# Patient Record
Sex: Male | Born: 1940
Health system: Southern US, Community
[De-identification: ages and names within clinical notes are randomized; demographics above are authoritative.]

## PROBLEM LIST (undated history)

## (undated) DIAGNOSIS — K219 Gastro-esophageal reflux disease without esophagitis: Secondary | ICD-10-CM

## (undated) DIAGNOSIS — Z87442 Personal history of urinary calculi: Secondary | ICD-10-CM

## (undated) DIAGNOSIS — J8410 Pulmonary fibrosis, unspecified: Secondary | ICD-10-CM

## (undated) DIAGNOSIS — N433 Hydrocele, unspecified: Secondary | ICD-10-CM

## (undated) DIAGNOSIS — N200 Calculus of kidney: Secondary | ICD-10-CM

## (undated) DIAGNOSIS — I10 Essential (primary) hypertension: Secondary | ICD-10-CM

## (undated) DIAGNOSIS — H9319 Tinnitus, unspecified ear: Secondary | ICD-10-CM

## (undated) DIAGNOSIS — E785 Hyperlipidemia, unspecified: Secondary | ICD-10-CM

## (undated) DIAGNOSIS — M47816 Spondylosis without myelopathy or radiculopathy, lumbar region: Secondary | ICD-10-CM

## (undated) DIAGNOSIS — K5790 Diverticulosis of intestine, part unspecified, without perforation or abscess without bleeding: Secondary | ICD-10-CM

## (undated) DIAGNOSIS — I4891 Unspecified atrial fibrillation: Secondary | ICD-10-CM

## (undated) DIAGNOSIS — K635 Polyp of colon: Secondary | ICD-10-CM

## (undated) DIAGNOSIS — I447 Left bundle-branch block, unspecified: Secondary | ICD-10-CM

## (undated) DIAGNOSIS — C61 Malignant neoplasm of prostate: Secondary | ICD-10-CM

## (undated) DIAGNOSIS — M5030 Other cervical disc degeneration, unspecified cervical region: Secondary | ICD-10-CM

## (undated) DIAGNOSIS — Z9841 Cataract extraction status, right eye: Secondary | ICD-10-CM

## (undated) DIAGNOSIS — Z7901 Long term (current) use of anticoagulants: Secondary | ICD-10-CM

## (undated) DIAGNOSIS — N529 Male erectile dysfunction, unspecified: Secondary | ICD-10-CM

## (undated) DIAGNOSIS — N320 Bladder-neck obstruction: Secondary | ICD-10-CM

## (undated) DIAGNOSIS — M48061 Spinal stenosis, lumbar region without neurogenic claudication: Secondary | ICD-10-CM

## (undated) DIAGNOSIS — I700 Atherosclerosis of aorta: Secondary | ICD-10-CM

## (undated) DIAGNOSIS — K8070 Calculus of gallbladder and bile duct without cholecystitis without obstruction: Secondary | ICD-10-CM

## (undated) DIAGNOSIS — J189 Pneumonia, unspecified organism: Secondary | ICD-10-CM

## (undated) DIAGNOSIS — M1990 Unspecified osteoarthritis, unspecified site: Secondary | ICD-10-CM

## (undated) DIAGNOSIS — R0600 Dyspnea, unspecified: Secondary | ICD-10-CM

## (undated) HISTORY — DX: Unspecified osteoarthritis, unspecified site: M19.90

## (undated) HISTORY — PX: HIP ARTHROPLASTY: SHX981

## (undated) HISTORY — PX: BACK SURGERY: SHX140

## (undated) HISTORY — PX: CATARACT EXTRACTION: SUR2

## (undated) HISTORY — PX: POLYPECTOMY: SHX149

## (undated) HISTORY — DX: Gastro-esophageal reflux disease without esophagitis: K21.9

## (undated) HISTORY — PX: PERINEAL PROSTATECTOMY: SUR1054

## (undated) HISTORY — PX: EYE SURGERY: SHX253

## (undated) HISTORY — PX: JOINT REPLACEMENT: SHX530

## (undated) HISTORY — PX: TONSILLECTOMY: SUR1361

---

## 2005-05-30 ENCOUNTER — Emergency Department: Payer: Self-pay | Admitting: Emergency Medicine

## 2005-09-17 ENCOUNTER — Ambulatory Visit: Payer: Self-pay | Admitting: Urology

## 2005-11-10 ENCOUNTER — Inpatient Hospital Stay: Payer: Self-pay | Admitting: Urology

## 2006-02-22 ENCOUNTER — Ambulatory Visit: Payer: Self-pay | Admitting: Internal Medicine

## 2006-07-20 HISTORY — PX: LUMBAR LAMINECTOMY: SHX95

## 2006-08-27 ENCOUNTER — Ambulatory Visit: Payer: Self-pay | Admitting: Gastroenterology

## 2007-06-20 ENCOUNTER — Ambulatory Visit: Payer: Self-pay | Admitting: Internal Medicine

## 2010-07-01 ENCOUNTER — Ambulatory Visit: Payer: Self-pay | Admitting: Unknown Physician Specialty

## 2010-07-04 LAB — PATHOLOGY REPORT

## 2011-02-18 ENCOUNTER — Ambulatory Visit: Payer: Self-pay | Admitting: Internal Medicine

## 2011-02-23 ENCOUNTER — Ambulatory Visit: Payer: Self-pay | Admitting: Internal Medicine

## 2011-04-10 ENCOUNTER — Ambulatory Visit: Payer: Self-pay | Admitting: Unknown Physician Specialty

## 2011-04-16 ENCOUNTER — Ambulatory Visit: Payer: Self-pay | Admitting: Unknown Physician Specialty

## 2011-05-18 ENCOUNTER — Other Ambulatory Visit: Payer: Self-pay | Admitting: Surgery

## 2011-10-12 ENCOUNTER — Ambulatory Visit: Payer: Self-pay | Admitting: Unknown Physician Specialty

## 2012-12-22 DIAGNOSIS — C61 Malignant neoplasm of prostate: Secondary | ICD-10-CM | POA: Insufficient documentation

## 2012-12-22 DIAGNOSIS — N529 Male erectile dysfunction, unspecified: Secondary | ICD-10-CM | POA: Insufficient documentation

## 2012-12-22 DIAGNOSIS — Z8546 Personal history of malignant neoplasm of prostate: Secondary | ICD-10-CM | POA: Insufficient documentation

## 2012-12-22 DIAGNOSIS — N320 Bladder-neck obstruction: Secondary | ICD-10-CM | POA: Insufficient documentation

## 2013-12-19 DIAGNOSIS — H9319 Tinnitus, unspecified ear: Secondary | ICD-10-CM | POA: Insufficient documentation

## 2013-12-19 DIAGNOSIS — I10 Essential (primary) hypertension: Secondary | ICD-10-CM | POA: Insufficient documentation

## 2013-12-19 DIAGNOSIS — K5790 Diverticulosis of intestine, part unspecified, without perforation or abscess without bleeding: Secondary | ICD-10-CM | POA: Insufficient documentation

## 2013-12-19 DIAGNOSIS — E785 Hyperlipidemia, unspecified: Secondary | ICD-10-CM | POA: Insufficient documentation

## 2013-12-19 DIAGNOSIS — M5137 Other intervertebral disc degeneration, lumbosacral region: Secondary | ICD-10-CM | POA: Insufficient documentation

## 2015-07-31 ENCOUNTER — Encounter: Payer: Self-pay | Admitting: *Deleted

## 2015-08-01 ENCOUNTER — Ambulatory Visit: Payer: PPO | Admitting: Anesthesiology

## 2015-08-01 ENCOUNTER — Encounter
Admission: RE | Disposition: A | Discharge status: Home / Self Care | Payer: Self-pay | Source: Ambulatory Visit | Attending: Unknown Physician Specialty

## 2015-08-01 ENCOUNTER — Encounter: Payer: Self-pay | Admitting: *Deleted

## 2015-08-01 ENCOUNTER — Ambulatory Visit
Admission: RE | Admit: 2015-08-01 | Discharge: 2015-08-01 | Disposition: A | Discharge status: Home / Self Care | Payer: PPO | Source: Ambulatory Visit | Attending: Unknown Physician Specialty | Admitting: Unknown Physician Specialty

## 2015-08-01 DIAGNOSIS — Z8546 Personal history of malignant neoplasm of prostate: Secondary | ICD-10-CM | POA: Diagnosis not present

## 2015-08-01 DIAGNOSIS — D123 Benign neoplasm of transverse colon: Secondary | ICD-10-CM | POA: Diagnosis not present

## 2015-08-01 DIAGNOSIS — I10 Essential (primary) hypertension: Secondary | ICD-10-CM | POA: Insufficient documentation

## 2015-08-01 DIAGNOSIS — K648 Other hemorrhoids: Secondary | ICD-10-CM | POA: Diagnosis not present

## 2015-08-01 DIAGNOSIS — D122 Benign neoplasm of ascending colon: Secondary | ICD-10-CM | POA: Diagnosis not present

## 2015-08-01 DIAGNOSIS — Z87891 Personal history of nicotine dependence: Secondary | ICD-10-CM | POA: Insufficient documentation

## 2015-08-01 DIAGNOSIS — Z1211 Encounter for screening for malignant neoplasm of colon: Secondary | ICD-10-CM | POA: Diagnosis not present

## 2015-08-01 DIAGNOSIS — K5730 Diverticulosis of large intestine without perforation or abscess without bleeding: Secondary | ICD-10-CM | POA: Diagnosis not present

## 2015-08-01 DIAGNOSIS — D120 Benign neoplasm of cecum: Secondary | ICD-10-CM | POA: Diagnosis not present

## 2015-08-01 DIAGNOSIS — Z79899 Other long term (current) drug therapy: Secondary | ICD-10-CM | POA: Insufficient documentation

## 2015-08-01 DIAGNOSIS — K5790 Diverticulosis of intestine, part unspecified, without perforation or abscess without bleeding: Secondary | ICD-10-CM | POA: Diagnosis not present

## 2015-08-01 DIAGNOSIS — K640 First degree hemorrhoids: Secondary | ICD-10-CM | POA: Insufficient documentation

## 2015-08-01 DIAGNOSIS — E785 Hyperlipidemia, unspecified: Secondary | ICD-10-CM | POA: Insufficient documentation

## 2015-08-01 DIAGNOSIS — K635 Polyp of colon: Secondary | ICD-10-CM | POA: Diagnosis not present

## 2015-08-01 DIAGNOSIS — Z86010 Personal history of colonic polyps: Secondary | ICD-10-CM | POA: Diagnosis not present

## 2015-08-01 DIAGNOSIS — Z7982 Long term (current) use of aspirin: Secondary | ICD-10-CM | POA: Insufficient documentation

## 2015-08-01 HISTORY — DX: Other cervical disc degeneration, unspecified cervical region: M50.30

## 2015-08-01 HISTORY — DX: Hyperlipidemia, unspecified: E78.5

## 2015-08-01 HISTORY — DX: Essential (primary) hypertension: I10

## 2015-08-01 HISTORY — PX: COLONOSCOPY WITH PROPOFOL: SHX5780

## 2015-08-01 HISTORY — DX: Malignant neoplasm of prostate: C61

## 2015-08-01 SURGERY — COLONOSCOPY WITH PROPOFOL
Anesthesia: General

## 2015-08-01 MED ORDER — SODIUM CHLORIDE 0.9 % IV SOLN
INTRAVENOUS | Status: DC
Start: 2015-08-01 — End: 2015-08-01
  Administered 2015-08-01: 11:00:00 via INTRAVENOUS

## 2015-08-01 MED ORDER — SODIUM CHLORIDE 0.9 % IV SOLN
INTRAVENOUS | Status: DC | PRN
Start: 2015-08-01 — End: 2015-08-01
  Administered 2015-08-01: 11:00:00 via INTRAVENOUS

## 2015-08-01 MED ORDER — SODIUM CHLORIDE 0.9 % IV SOLN: INTRAVENOUS | Status: DC

## 2015-08-01 MED ORDER — PROPOFOL 10 MG/ML IV BOLUS
INTRAVENOUS | Status: DC | PRN
  Administered 2015-08-01: 230 mg via INTRAVENOUS

## 2015-08-01 NOTE — Transfer of Care (Signed)
Immediate Anesthesia Transfer of Care Note  Patient: John Marks  Procedure(s) Performed: Procedure(s): COLONOSCOPY WITH PROPOFOL (N/A)  Patient Location: PACU  Anesthesia Type:General  Level of Consciousness: awake, alert  and oriented  Airway & Oxygen Therapy: Patient Spontanous Breathing and Patient connected to nasal cannula oxygen  Post-op Assessment: Report given to RN and Post -op Vital signs reviewed and stable  Post vital signs: Reviewed and stable  Last Vitals:  Filed Vitals:   08/01/15 1031 08/01/15 1154  BP: 128/82 110/77  Pulse: 86 71  Temp: 36.8 C 36.4 C  Resp: 14 11    Complications: No apparent anesthesia complications

## 2015-08-01 NOTE — Anesthesia Preprocedure Evaluation (Addendum)
Anesthesia Evaluation  Patient identified by MRN, date of birth, ID band Patient awake    Reviewed: Allergy & Precautions, H&P , NPO status , Patient's Chart, lab work & pertinent test results, reviewed documented beta blocker date and time   Airway Mallampati: II  TM Distance: >3 FB Neck ROM: full    Dental no notable dental hx. (+) Teeth Intact   Pulmonary neg pulmonary ROS, former smoker,    Pulmonary exam normal breath sounds clear to auscultation       Cardiovascular Exercise Tolerance: Good hypertension, negative cardio ROS   Rhythm:regular Rate:Normal     Neuro/Psych negative neurological ROS  negative psych ROS   GI/Hepatic negative GI ROS, Neg liver ROS,   Endo/Other  negative endocrine ROS  Renal/GU negative Renal ROS  negative genitourinary   Musculoskeletal   Abdominal   Peds  Hematology negative hematology ROS (+)   Anesthesia Other Findings   Reproductive/Obstetrics negative OB ROS                            Anesthesia Physical Anesthesia Plan  ASA: II  Anesthesia Plan: General   Post-op Pain Management:    Induction:   Airway Management Planned:   Additional Equipment:   Intra-op Plan:   Post-operative Plan:   Informed Consent: I have reviewed the patients History and Physical, chart, labs and discussed the procedure including the risks, benefits and alternatives for the proposed anesthesia with the patient or authorized representative who has indicated his/her understanding and acceptance.   Dental Advisory Given  Plan Discussed with: CRNA  Anesthesia Plan Comments:         Anesthesia Quick Evaluation

## 2015-08-01 NOTE — Op Note (Signed)
[facility] Patient Name: John Marks Procedure Date: 08/01/2015 11:21 AM MRN: [id number] Account #: [id number] Date of Birth: February 14, 1941 Admit Type: Outpatient Age: 75 Room: [facility] ENDO ROOM 1 Gender: Male Note Status: Finalized Procedure:         Colonoscopy Indications:       High risk colon cancer surveillance: Personal history of                     colonic polyps Providers:         Manya Silvas, MD Referring MD:      Leonie Douglas. Doy Hutching, MD (Referring MD) Medicines:         Propofol per Anesthesia Complications:     No immediate complications. Procedure:         Pre-Anesthesia Assessment:                    - After reviewing the risks and benefits, the patient was                     deemed in satisfactory condition to undergo the procedure.                    After obtaining informed consent, the colonoscope was                     passed under direct vision. Throughout the procedure, the                     patient's blood pressure, pulse, and oxygen saturations                     were monitored continuously. The Colonoscope was                     introduced through the anus and advanced to the the cecum,                     identified by appendiceal orifice and ileocecal valve. The                     colonoscopy was somewhat difficult due to restricted                     mobility of the colon. Successful completion of the                     procedure was aided by applying abdominal pressure. The                     patient tolerated the procedure well. The quality of the                     bowel preparation was good. Findings:      A diminutive polyp was found at the hepatic flexure. The polyp was       sessile. The polyp was removed with a jumbo cold forceps. Resection and       retrieval were complete.      Multiple small-mouthed diverticula were found in the sigmoid colon and       in the descending colon.      Internal  hemorrhoids were found during endoscopy. The hemorrhoids were       small and Grade I (internal hemorrhoids that do not  prolapse).      The exam was otherwise without abnormality.      A diminutive polyp was found in the ascending colon. The polyp was       sessile. The polyp was removed with a cold biopsy forceps. Resection and       retrieval were complete. Impression:        - One diminutive polyp at the hepatic flexure. Resected                     and retrieved.                    - Diverticulosis in the sigmoid colon and in the                     descending colon.                    - Internal hemorrhoids.                    - The examination was otherwise normal. Recommendation:    - Await pathology results. Manya Silvas, MD 08/01/2015 11:53:54 AM This report has been signed electronically. Number of Addenda: 0 Note Initiated On: 08/01/2015 11:21 AM Scope Withdrawal Time: 0 hours 12 minutes 1 second  Total Procedure Duration: 0 hours 20 minutes 31 seconds       [facility]

## 2015-08-01 NOTE — H&P (Signed)
   Primary Care Physician:  Idelle Crouch, MD Primary Gastroenterologist:  Dr. Vira Agar  Pre-Procedure History & Physical: HPI:  John Marks is a 75 y.o. male is here for an colonoscopy.   Past Medical History  Diagnosis Date  . Hyperlipidemia   . Prostate cancer ([place])   . Hypertension   . DDD (degenerative disc disease), cervical     Past Surgical History  Procedure Laterality Date  . Perineal prostatectomy      Prior to Admission medications   Medication Sig Start Date End Date Taking? Authorizing Provider  aspirin 81 MG tablet Take 81 mg by mouth daily.   Yes Historical Provider, MD  atorvastatin (LIPITOR) 40 MG tablet Take 40 mg by mouth daily.   Yes Historical Provider, MD  Choline Fenofibrate 135 MG capsule Take 135 mg by mouth daily.   Yes Historical Provider, MD  levofloxacin (LEVAQUIN) 500 MG tablet Take 500 mg by mouth daily.   Yes Historical Provider, MD    Allergies as of 07/04/2015  . (Not on File)    History reviewed. No pertinent family history.  Social History   Social History  . Marital Status: Married    Spouse Name: N/A  . Number of Children: N/A  . Years of Education: N/A   Occupational History  . Not on file.   Social History Main Topics  . Smoking status: Former Smoker    Quit date: 07/21/1987  . Smokeless tobacco: Never Used  . Alcohol Use: 0.6 oz/week    1 Glasses of wine per week  . Drug Use: No  . Sexual Activity: Not on file   Other Topics Concern  . Not on file   Social History Narrative    Review of Systems: See HPI, otherwise negative ROS  Physical Exam: BP 128/82 mmHg  Pulse 86  Temp(Src) 98.3 F (36.8 C) (Tympanic)  Resp 14  Ht 6' (1.829 m)  Wt 92.987 kg (205 lb)  BMI 27.80 kg/m2  SpO2 98% General:   Alert,  pleasant and cooperative in NAD Head:  Normocephalic and atraumatic. Neck:  Supple; no masses or thyromegaly. Lungs:  Clear throughout to auscultation.    Heart:  Regular rate and rhythm. Abdomen:   Soft, nontender and nondistended. Normal bowel sounds, without guarding, and without rebound.   Neurologic:  Alert and  oriented x4;  grossly normal neurologically.  Impression/Plan: John Marks is here for an colonoscopy to be performed for [facility] colon polyps  Risks, benefits, limitations, and alternatives regarding  colonoscopy have been reviewed with the patient.  Questions have been answered.  All parties agreeable.   Gaylyn Cheers, MD  08/01/2015, 11:20 AM

## 2015-08-02 LAB — SURGICAL PATHOLOGY

## 2015-08-04 NOTE — Anesthesia Postprocedure Evaluation (Signed)
Anesthesia Post Note  Patient: John Marks  Procedure(s) Performed: Procedure(s) (LRB): COLONOSCOPY WITH PROPOFOL (N/A)  Patient location during evaluation: PACU Anesthesia Type: General Level of consciousness: awake and alert Pain management: pain level controlled Vital Signs Assessment: post-procedure vital signs reviewed and stable Respiratory status: spontaneous breathing, nonlabored ventilation, respiratory function stable and patient connected to nasal cannula oxygen Cardiovascular status: blood pressure returned to baseline and stable Postop Assessment: no signs of nausea or vomiting Anesthetic complications: no    Last Vitals:  Filed Vitals:   08/01/15 1220 08/01/15 1230  BP: 143/70 136/77  Pulse: 69 65  Temp:    Resp: 18 15    Last Pain:  Filed Vitals:   08/02/15 0747  PainSc: 0-No pain                 Molli Barrows

## 2015-11-18 DIAGNOSIS — Z79899 Other long term (current) drug therapy: Secondary | ICD-10-CM | POA: Diagnosis not present

## 2015-11-18 DIAGNOSIS — E782 Mixed hyperlipidemia: Secondary | ICD-10-CM | POA: Diagnosis not present

## 2015-11-18 DIAGNOSIS — H6122 Impacted cerumen, left ear: Secondary | ICD-10-CM | POA: Diagnosis not present

## 2015-11-18 DIAGNOSIS — M5137 Other intervertebral disc degeneration, lumbosacral region: Secondary | ICD-10-CM | POA: Diagnosis not present

## 2015-11-18 DIAGNOSIS — C61 Malignant neoplasm of prostate: Secondary | ICD-10-CM | POA: Diagnosis not present

## 2015-11-18 DIAGNOSIS — Z0000 Encounter for general adult medical examination without abnormal findings: Secondary | ICD-10-CM | POA: Diagnosis not present

## 2015-11-18 DIAGNOSIS — I10 Essential (primary) hypertension: Secondary | ICD-10-CM | POA: Diagnosis not present

## 2015-11-19 DIAGNOSIS — E782 Mixed hyperlipidemia: Secondary | ICD-10-CM | POA: Diagnosis not present

## 2015-11-19 DIAGNOSIS — Z79899 Other long term (current) drug therapy: Secondary | ICD-10-CM | POA: Diagnosis not present

## 2015-12-19 DIAGNOSIS — D485 Neoplasm of uncertain behavior of skin: Secondary | ICD-10-CM | POA: Diagnosis not present

## 2015-12-19 DIAGNOSIS — L218 Other seborrheic dermatitis: Secondary | ICD-10-CM | POA: Diagnosis not present

## 2015-12-19 DIAGNOSIS — X32XXXA Exposure to sunlight, initial encounter: Secondary | ICD-10-CM | POA: Diagnosis not present

## 2015-12-19 DIAGNOSIS — Z08 Encounter for follow-up examination after completed treatment for malignant neoplasm: Secondary | ICD-10-CM | POA: Diagnosis not present

## 2015-12-19 DIAGNOSIS — C44311 Basal cell carcinoma of skin of nose: Secondary | ICD-10-CM | POA: Diagnosis not present

## 2015-12-19 DIAGNOSIS — L570 Actinic keratosis: Secondary | ICD-10-CM | POA: Diagnosis not present

## 2015-12-19 DIAGNOSIS — Z85828 Personal history of other malignant neoplasm of skin: Secondary | ICD-10-CM | POA: Diagnosis not present

## 2015-12-30 DIAGNOSIS — H6123 Impacted cerumen, bilateral: Secondary | ICD-10-CM | POA: Diagnosis not present

## 2015-12-30 DIAGNOSIS — H6062 Unspecified chronic otitis externa, left ear: Secondary | ICD-10-CM | POA: Diagnosis not present

## 2016-02-04 DIAGNOSIS — X32XXXA Exposure to sunlight, initial encounter: Secondary | ICD-10-CM | POA: Diagnosis not present

## 2016-02-04 DIAGNOSIS — L728 Other follicular cysts of the skin and subcutaneous tissue: Secondary | ICD-10-CM | POA: Diagnosis not present

## 2016-02-04 DIAGNOSIS — L570 Actinic keratosis: Secondary | ICD-10-CM | POA: Diagnosis not present

## 2016-02-19 DIAGNOSIS — Z8546 Personal history of malignant neoplasm of prostate: Secondary | ICD-10-CM | POA: Diagnosis not present

## 2016-02-19 DIAGNOSIS — N529 Male erectile dysfunction, unspecified: Secondary | ICD-10-CM | POA: Diagnosis not present

## 2016-02-19 DIAGNOSIS — N320 Bladder-neck obstruction: Secondary | ICD-10-CM | POA: Diagnosis not present

## 2016-03-10 DIAGNOSIS — C44311 Basal cell carcinoma of skin of nose: Secondary | ICD-10-CM | POA: Diagnosis not present

## 2016-03-19 DIAGNOSIS — D485 Neoplasm of uncertain behavior of skin: Secondary | ICD-10-CM | POA: Diagnosis not present

## 2016-03-19 DIAGNOSIS — L989 Disorder of the skin and subcutaneous tissue, unspecified: Secondary | ICD-10-CM | POA: Diagnosis not present

## 2016-04-06 DIAGNOSIS — H02839 Dermatochalasis of unspecified eye, unspecified eyelid: Secondary | ICD-10-CM | POA: Diagnosis not present

## 2016-04-06 DIAGNOSIS — H2513 Age-related nuclear cataract, bilateral: Secondary | ICD-10-CM | POA: Diagnosis not present

## 2016-05-21 DIAGNOSIS — Z23 Encounter for immunization: Secondary | ICD-10-CM | POA: Diagnosis not present

## 2016-05-21 DIAGNOSIS — E782 Mixed hyperlipidemia: Secondary | ICD-10-CM | POA: Diagnosis not present

## 2016-05-21 DIAGNOSIS — M1611 Unilateral primary osteoarthritis, right hip: Secondary | ICD-10-CM | POA: Diagnosis not present

## 2016-05-21 DIAGNOSIS — I10 Essential (primary) hypertension: Secondary | ICD-10-CM | POA: Diagnosis not present

## 2016-05-21 DIAGNOSIS — M25512 Pain in left shoulder: Secondary | ICD-10-CM | POA: Diagnosis not present

## 2016-05-21 DIAGNOSIS — C61 Malignant neoplasm of prostate: Secondary | ICD-10-CM | POA: Diagnosis not present

## 2016-05-21 DIAGNOSIS — M19012 Primary osteoarthritis, left shoulder: Secondary | ICD-10-CM | POA: Diagnosis not present

## 2016-05-21 DIAGNOSIS — M25551 Pain in right hip: Secondary | ICD-10-CM | POA: Diagnosis not present

## 2016-05-21 DIAGNOSIS — M5137 Other intervertebral disc degeneration, lumbosacral region: Secondary | ICD-10-CM | POA: Diagnosis not present

## 2016-05-21 DIAGNOSIS — Z79899 Other long term (current) drug therapy: Secondary | ICD-10-CM | POA: Diagnosis not present

## 2016-05-22 DIAGNOSIS — X32XXXA Exposure to sunlight, initial encounter: Secondary | ICD-10-CM | POA: Diagnosis not present

## 2016-05-22 DIAGNOSIS — D2261 Melanocytic nevi of right upper limb, including shoulder: Secondary | ICD-10-CM | POA: Diagnosis not present

## 2016-05-22 DIAGNOSIS — D225 Melanocytic nevi of trunk: Secondary | ICD-10-CM | POA: Diagnosis not present

## 2016-05-22 DIAGNOSIS — Z85828 Personal history of other malignant neoplasm of skin: Secondary | ICD-10-CM | POA: Diagnosis not present

## 2016-05-22 DIAGNOSIS — D2272 Melanocytic nevi of left lower limb, including hip: Secondary | ICD-10-CM | POA: Diagnosis not present

## 2016-05-22 DIAGNOSIS — L570 Actinic keratosis: Secondary | ICD-10-CM | POA: Diagnosis not present

## 2016-05-27 DIAGNOSIS — E782 Mixed hyperlipidemia: Secondary | ICD-10-CM | POA: Diagnosis not present

## 2016-05-27 DIAGNOSIS — Z79899 Other long term (current) drug therapy: Secondary | ICD-10-CM | POA: Diagnosis not present

## 2016-10-21 DIAGNOSIS — S90859A Superficial foreign body, unspecified foot, initial encounter: Secondary | ICD-10-CM | POA: Diagnosis not present

## 2017-01-06 DIAGNOSIS — C44311 Basal cell carcinoma of skin of nose: Secondary | ICD-10-CM | POA: Diagnosis not present

## 2017-01-06 DIAGNOSIS — B353 Tinea pedis: Secondary | ICD-10-CM | POA: Diagnosis not present

## 2017-01-06 DIAGNOSIS — D485 Neoplasm of uncertain behavior of skin: Secondary | ICD-10-CM | POA: Diagnosis not present

## 2017-02-01 DIAGNOSIS — Z85828 Personal history of other malignant neoplasm of skin: Secondary | ICD-10-CM | POA: Diagnosis not present

## 2017-02-01 DIAGNOSIS — X32XXXA Exposure to sunlight, initial encounter: Secondary | ICD-10-CM | POA: Diagnosis not present

## 2017-02-01 DIAGNOSIS — Z08 Encounter for follow-up examination after completed treatment for malignant neoplasm: Secondary | ICD-10-CM | POA: Diagnosis not present

## 2017-02-01 DIAGNOSIS — D225 Melanocytic nevi of trunk: Secondary | ICD-10-CM | POA: Diagnosis not present

## 2017-02-01 DIAGNOSIS — B353 Tinea pedis: Secondary | ICD-10-CM | POA: Diagnosis not present

## 2017-02-01 DIAGNOSIS — L570 Actinic keratosis: Secondary | ICD-10-CM | POA: Diagnosis not present

## 2017-02-17 DIAGNOSIS — C44311 Basal cell carcinoma of skin of nose: Secondary | ICD-10-CM | POA: Diagnosis not present

## 2017-02-17 DIAGNOSIS — Z85828 Personal history of other malignant neoplasm of skin: Secondary | ICD-10-CM | POA: Diagnosis not present

## 2017-02-19 DIAGNOSIS — S0120XA Unspecified open wound of nose, initial encounter: Secondary | ICD-10-CM | POA: Diagnosis not present

## 2017-02-19 DIAGNOSIS — Z0000 Encounter for general adult medical examination without abnormal findings: Secondary | ICD-10-CM | POA: Diagnosis not present

## 2017-02-19 DIAGNOSIS — R0609 Other forms of dyspnea: Secondary | ICD-10-CM | POA: Diagnosis not present

## 2017-02-19 DIAGNOSIS — Z23 Encounter for immunization: Secondary | ICD-10-CM | POA: Diagnosis not present

## 2017-02-19 DIAGNOSIS — M5137 Other intervertebral disc degeneration, lumbosacral region: Secondary | ICD-10-CM | POA: Diagnosis not present

## 2017-02-19 DIAGNOSIS — Z125 Encounter for screening for malignant neoplasm of prostate: Secondary | ICD-10-CM | POA: Diagnosis not present

## 2017-02-19 DIAGNOSIS — E782 Mixed hyperlipidemia: Secondary | ICD-10-CM | POA: Diagnosis not present

## 2017-02-19 DIAGNOSIS — I10 Essential (primary) hypertension: Secondary | ICD-10-CM | POA: Diagnosis not present

## 2017-02-19 DIAGNOSIS — Z79899 Other long term (current) drug therapy: Secondary | ICD-10-CM | POA: Diagnosis not present

## 2017-02-25 DIAGNOSIS — R0609 Other forms of dyspnea: Secondary | ICD-10-CM | POA: Diagnosis not present

## 2017-02-25 DIAGNOSIS — I10 Essential (primary) hypertension: Secondary | ICD-10-CM | POA: Diagnosis not present

## 2017-02-25 DIAGNOSIS — I5189 Other ill-defined heart diseases: Secondary | ICD-10-CM

## 2017-02-25 HISTORY — DX: Other ill-defined heart diseases: I51.89

## 2017-04-02 DIAGNOSIS — H2513 Age-related nuclear cataract, bilateral: Secondary | ICD-10-CM | POA: Diagnosis not present

## 2017-04-02 DIAGNOSIS — H524 Presbyopia: Secondary | ICD-10-CM | POA: Diagnosis not present

## 2017-04-02 DIAGNOSIS — H02834 Dermatochalasis of left upper eyelid: Secondary | ICD-10-CM | POA: Diagnosis not present

## 2017-04-02 DIAGNOSIS — H02831 Dermatochalasis of right upper eyelid: Secondary | ICD-10-CM | POA: Diagnosis not present

## 2017-08-04 DIAGNOSIS — X32XXXA Exposure to sunlight, initial encounter: Secondary | ICD-10-CM | POA: Diagnosis not present

## 2017-08-04 DIAGNOSIS — D225 Melanocytic nevi of trunk: Secondary | ICD-10-CM | POA: Diagnosis not present

## 2017-08-04 DIAGNOSIS — Z85828 Personal history of other malignant neoplasm of skin: Secondary | ICD-10-CM | POA: Diagnosis not present

## 2017-08-04 DIAGNOSIS — D2261 Melanocytic nevi of right upper limb, including shoulder: Secondary | ICD-10-CM | POA: Diagnosis not present

## 2017-08-04 DIAGNOSIS — L570 Actinic keratosis: Secondary | ICD-10-CM | POA: Diagnosis not present

## 2017-08-04 DIAGNOSIS — L720 Epidermal cyst: Secondary | ICD-10-CM | POA: Diagnosis not present

## 2017-08-23 DIAGNOSIS — E782 Mixed hyperlipidemia: Secondary | ICD-10-CM | POA: Diagnosis not present

## 2017-08-23 DIAGNOSIS — I10 Essential (primary) hypertension: Secondary | ICD-10-CM | POA: Diagnosis not present

## 2017-08-23 DIAGNOSIS — C61 Malignant neoplasm of prostate: Secondary | ICD-10-CM | POA: Diagnosis not present

## 2017-08-23 DIAGNOSIS — Z23 Encounter for immunization: Secondary | ICD-10-CM | POA: Diagnosis not present

## 2017-08-23 DIAGNOSIS — Z79899 Other long term (current) drug therapy: Secondary | ICD-10-CM | POA: Diagnosis not present

## 2017-08-23 DIAGNOSIS — M79602 Pain in left arm: Secondary | ICD-10-CM | POA: Diagnosis not present

## 2017-08-30 DIAGNOSIS — M79602 Pain in left arm: Secondary | ICD-10-CM | POA: Diagnosis not present

## 2017-11-17 DIAGNOSIS — R197 Diarrhea, unspecified: Secondary | ICD-10-CM | POA: Diagnosis not present

## 2017-11-17 DIAGNOSIS — R1110 Vomiting, unspecified: Secondary | ICD-10-CM | POA: Diagnosis not present

## 2017-11-18 ENCOUNTER — Encounter: Payer: Self-pay | Admitting: Emergency Medicine

## 2017-11-18 ENCOUNTER — Emergency Department
Admission: EM | Admit: 2017-11-18 | Discharge: 2017-11-19 | Disposition: A | Discharge status: Home / Self Care | Payer: PPO | Attending: Emergency Medicine | Admitting: Emergency Medicine

## 2017-11-18 ENCOUNTER — Other Ambulatory Visit: Payer: Self-pay

## 2017-11-18 DIAGNOSIS — I10 Essential (primary) hypertension: Secondary | ICD-10-CM | POA: Diagnosis not present

## 2017-11-18 DIAGNOSIS — Z91040 Latex allergy status: Secondary | ICD-10-CM | POA: Diagnosis not present

## 2017-11-18 DIAGNOSIS — Z87891 Personal history of nicotine dependence: Secondary | ICD-10-CM | POA: Insufficient documentation

## 2017-11-18 DIAGNOSIS — R197 Diarrhea, unspecified: Secondary | ICD-10-CM | POA: Insufficient documentation

## 2017-11-18 DIAGNOSIS — Z79899 Other long term (current) drug therapy: Secondary | ICD-10-CM | POA: Diagnosis not present

## 2017-11-18 DIAGNOSIS — I4892 Unspecified atrial flutter: Secondary | ICD-10-CM | POA: Insufficient documentation

## 2017-11-18 DIAGNOSIS — R000 Tachycardia, unspecified: Secondary | ICD-10-CM | POA: Diagnosis not present

## 2017-11-18 DIAGNOSIS — I4891 Unspecified atrial fibrillation: Secondary | ICD-10-CM | POA: Insufficient documentation

## 2017-11-18 DIAGNOSIS — R112 Nausea with vomiting, unspecified: Secondary | ICD-10-CM | POA: Insufficient documentation

## 2017-11-18 DIAGNOSIS — Z8546 Personal history of malignant neoplasm of prostate: Secondary | ICD-10-CM | POA: Insufficient documentation

## 2017-11-18 DIAGNOSIS — Z7982 Long term (current) use of aspirin: Secondary | ICD-10-CM | POA: Insufficient documentation

## 2017-11-18 LAB — COMPREHENSIVE METABOLIC PANEL
ALT: 19 U/L (ref 17–63)
AST: 24 U/L (ref 15–41)
Albumin: 4.8 g/dL (ref 3.5–5.0)
Alkaline Phosphatase: 50 U/L (ref 38–126)
Anion gap: 9 (ref 5–15)
BUN: 20 mg/dL (ref 6–20)
CHLORIDE: 103 mmol/L (ref 101–111)
CO2: 24 mmol/L (ref 22–32)
Calcium: 9.5 mg/dL (ref 8.9–10.3)
Creatinine, Ser: 1.09 mg/dL (ref 0.61–1.24)
GFR calc Af Amer: >60 mL/min (ref >60)
Glucose, Bld: 143 mg/dL — ABNORMAL HIGH (ref 65–99)
POTASSIUM: 4 mmol/L (ref 3.5–5.1)
SODIUM: 136 mmol/L (ref 135–145)
Total Bilirubin: 0.9 mg/dL (ref 0.3–1.2)
Total Protein: 8.2 g/dL — ABNORMAL HIGH (ref 6.5–8.1)

## 2017-11-18 LAB — CBC WITH DIFFERENTIAL/PLATELET
BASOS ABS: 0.1 10*3/uL (ref 0–0.1)
BASOS PCT: 1 %
EOS ABS: 0.8 10*3/uL — AB (ref 0–0.7)
EOS PCT: 5 %
HCT: 50.6 % (ref 40.0–52.0)
Hemoglobin: 17.2 g/dL (ref 13.0–18.0)
LYMPHS PCT: 9 %
Lymphs Abs: 1.3 10*3/uL (ref 1.0–3.6)
MCH: 30.3 pg (ref 26.0–34.0)
MCHC: 33.9 g/dL (ref 32.0–36.0)
MCV: 89.2 fL (ref 80.0–100.0)
Monocytes Absolute: 1.1 10*3/uL — ABNORMAL HIGH (ref 0.2–1.0)
Monocytes Relative: 7 %
Neutro Abs: 11.6 10*3/uL — ABNORMAL HIGH (ref 1.4–6.5)
Neutrophils Relative %: 78 %
PLATELETS: 257 10*3/uL (ref 150–440)
RBC: 5.66 MIL/uL (ref 4.40–5.90)
RDW: 13.6 % (ref 11.5–14.5)
WBC: 14.9 10*3/uL — ABNORMAL HIGH (ref 3.8–10.6)

## 2017-11-18 LAB — TROPONIN I: Troponin I: <0.03 ng/mL (ref <0.03)

## 2017-11-18 LAB — URINALYSIS, COMPLETE (UACMP) WITH MICROSCOPIC
Bilirubin Urine: NEGATIVE
Glucose, UA: NEGATIVE mg/dL
Hgb urine dipstick: NEGATIVE
KETONES UR: NEGATIVE mg/dL
Leukocytes, UA: NEGATIVE
NITRITE: NEGATIVE
PH: 5 (ref 5.0–8.0)
Protein, ur: 30 mg/dL — AB
Specific Gravity, Urine: 1.021 (ref 1.005–1.030)

## 2017-11-18 LAB — GASTROINTESTINAL PANEL BY PCR, STOOL (REPLACES STOOL CULTURE)
ASTROVIRUS: NOT DETECTED
Adenovirus F40/41: NOT DETECTED
CYCLOSPORA CAYETANENSIS: NOT DETECTED
Campylobacter species: NOT DETECTED
Cryptosporidium: NOT DETECTED
ENTAMOEBA HISTOLYTICA: NOT DETECTED
ENTEROAGGREGATIVE E COLI (EAEC): NOT DETECTED
ENTEROPATHOGENIC E COLI (EPEC): NOT DETECTED
ENTEROTOXIGENIC E COLI (ETEC): NOT DETECTED
Giardia lamblia: NOT DETECTED
Norovirus GI/GII: NOT DETECTED
Plesimonas shigelloides: NOT DETECTED
Rotavirus A: NOT DETECTED
Salmonella species: NOT DETECTED
Sapovirus (I, II, IV, and V): NOT DETECTED
Shiga like toxin producing E coli (STEC): NOT DETECTED
Shigella/Enteroinvasive E coli (EIEC): NOT DETECTED
VIBRIO CHOLERAE: NOT DETECTED
VIBRIO SPECIES: NOT DETECTED
Yersinia enterocolitica: NOT DETECTED

## 2017-11-18 LAB — C DIFFICILE QUICK SCREEN W PCR REFLEX
C Diff antigen: NEGATIVE
C Diff interpretation: NOT DETECTED
C Diff toxin: NEGATIVE

## 2017-11-18 MED ORDER — ONDANSETRON HCL 4 MG/2ML IJ SOLN
4.0000 mg | Freq: Once | INTRAMUSCULAR | Status: AC
Start: 2017-11-18 — End: 2017-11-18
  Administered 2017-11-18: 4 mg via INTRAVENOUS

## 2017-11-18 MED ORDER — ONDANSETRON HCL 4 MG PO TABS: 4.0000 mg | ORAL_TABLET | Freq: Every day | ORAL | 0 refills | Status: DC | PRN

## 2017-11-18 MED ORDER — SODIUM CHLORIDE 0.9 % IV BOLUS
1000.0000 mL | Freq: Once | INTRAVENOUS | Status: AC
  Administered 2017-11-18: 1000 mL via INTRAVENOUS

## 2017-11-18 MED ORDER — METOPROLOL TARTRATE 25 MG PO TABS
12.5000 mg | ORAL_TABLET | Freq: Once | ORAL | Status: AC
  Administered 2017-11-18: 12.5 mg via ORAL
  Filled 2017-11-18: qty 1

## 2017-11-18 MED ORDER — DIPHENOXYLATE-ATROPINE 2.5-0.025 MG PO TABS: 1.0000 | ORAL_TABLET | Freq: Four times a day (QID) | ORAL | 0 refills | Status: AC | PRN

## 2017-11-18 MED ORDER — ONDANSETRON HCL 4 MG/2ML IJ SOLN
INTRAMUSCULAR | Status: AC
  Filled 2017-11-18: qty 2

## 2017-11-18 MED ORDER — METOPROLOL TARTRATE 25 MG PO TABS: 12.5000 mg | ORAL_TABLET | Freq: Two times a day (BID) | ORAL | 0 refills | Status: DC

## 2017-11-18 MED ORDER — DILTIAZEM HCL 25 MG/5ML IV SOLN
16.0000 mg | Freq: Once | INTRAVENOUS | Status: AC
  Administered 2017-11-18: 16 mg via INTRAVENOUS
  Filled 2017-11-18: qty 5

## 2017-11-18 NOTE — ED Notes (Signed)
Pt in nsr   Repeat ekg done.

## 2017-11-18 NOTE — ED Triage Notes (Signed)
Pt arrived via POV from home with reports of tachycardia. Pt was laying down sleeping and when his apple watch alerted to him that his heart rate was 203.   Pt states he has had diarrhea since Saturday, 7 stools today.  Reports eating raw oysters last week.  Pt denies any chest pain. No hx of a fib or heart trouble   Pt is alert and oriented.

## 2017-11-18 NOTE — ED Notes (Signed)
Pt vomiting   md aware.

## 2017-11-18 NOTE — ED Notes (Signed)
Report off to allison rn  

## 2017-11-18 NOTE — ED Provider Notes (Addendum)
[facility] Emergency Department Provider Note  ___________________________________________   First MD Initiated Contact with Patient 11/18/17 1706     (approximate)  I have reviewed the triage vital signs and the nursing notes.   HISTORY  Chief Complaint Tachycardia  HPI John Marks is a 77 y.o. male with a history of hyperlipidemia as well as hypertension was presenting to the emergency department today with a rapid heart rate.  He says that he has been having diarrhea over the past week, 7-8 times per day.  Denies any blood in his stool.  Says that he has had intermittent abdominal cramping.  Says that he was feeling very weak today and then laid down to take a nap and his apple watch alerted him to tachycardia.  He says that his heart rate was up to 140s to 150s and then intermittently down to the 50s.  He is denying any chest pain, shortness of breath or palpitations.  No history of atrial fibrillation or heart attack.  Denies any recent hospitalization, antibiotic use or out of country travel.  Past Medical History:  Diagnosis Date  . DDD (degenerative disc disease), cervical   . Hyperlipidemia   . Hypertension   . Prostate cancer ([place])     There are no active problems to display for this patient.   Past Surgical History:  Procedure Laterality Date  . COLONOSCOPY WITH PROPOFOL N/A 08/01/2015   Procedure: COLONOSCOPY WITH PROPOFOL;  Surgeon: Manya Silvas, MD;  Location: [facility] ENDOSCOPY;  Service: Endoscopy;  Laterality: N/A;  . PERINEAL PROSTATECTOMY      Prior to Admission medications   Medication Sig Start Date End Date Taking? Authorizing Provider  aspirin 81 MG tablet Take 81 mg by mouth daily.    [provider]  atorvastatin (LIPITOR) 40 MG tablet Take 40 mg by mouth daily.    [provider]  Choline Fenofibrate 135 MG capsule Take 135 mg by mouth daily.    [provider]  levofloxacin (LEVAQUIN) 500 MG  tablet Take 500 mg by mouth daily.    [provider]    Allergies Latex  History reviewed. No pertinent family history.  Social History Social History   Tobacco Use  . Smoking status: Former Smoker    Last attempt to quit: 07/21/1987    Years since quitting: 30.3  . Smokeless tobacco: Never Used  Substance Use Topics  . Alcohol use: Yes    Alcohol/week: 0.6 oz    Types: 1 Glasses of wine per week  . Drug use: No    Review of Systems  Constitutional: No fever/chills Eyes: No visual changes. ENT: No sore throat. Cardiovascular: Denies chest pain. Respiratory: Denies shortness of breath. Gastrointestinal: No abdominal pain.  No nausea, no vomiting.  No constipation. Genitourinary: Negative for dysuria. Musculoskeletal: Negative for back pain. Skin: Negative for rash. Neurological: Negative for headaches, focal weakness or numbness.   ____________________________________________   PHYSICAL EXAM:  VITAL SIGNS: ED Triage Vitals  Enc Vitals Group     BP 11/18/17 1714 (!) 154/119     Pulse Rate 11/18/17 1714 (!) 149     Resp 11/18/17 1714 18     Temp 11/18/17 1714 98.7 F (37.1 C)     Temp Source 11/18/17 1714 Oral     SpO2 11/18/17 1714 99 %     Weight 11/18/17 1716 205 lb (93 kg)     Height 11/18/17 1716 5\' 11"  (1.803 m)  Head Circumference --      Peak Flow --      Pain Score 11/18/17 1717 5     Pain Loc --      Pain Edu? --      Excl. in [place]? --     Constitutional: Alert and oriented. Well appearing and in no acute distress. Eyes: Conjunctivae are normal.  Head: Atraumatic. Nose: No congestion/rhinnorhea. Mouth/Throat: Mucous membranes are moist.  Neck: No stridor.   Cardiovascular: Tachycardic with a regular rhythm.  Grossly normal heart sounds.  Respiratory: Normal respiratory effort.  No retractions. Lungs CTAB. Gastrointestinal: Soft and nontender. No distention. Musculoskeletal: No lower extremity tenderness nor edema.  No joint  effusions. Neurologic:  Normal speech and language. No gross focal neurologic deficits are appreciated. Skin:  Skin is warm, dry and intact. No rash noted. Psychiatric: Mood and affect are normal. Speech and behavior are normal.  ____________________________________________   LABS (all labs ordered are listed, but only abnormal results are displayed)  Labs Reviewed  CBC WITH DIFFERENTIAL/PLATELET - Abnormal; Notable for the following components:      Result Value   WBC 14.9 (*)    Neutro Abs 11.6 (*)    Monocytes Absolute 1.1 (*)    Eosinophils Absolute 0.8 (*)    All other components within normal limits  COMPREHENSIVE METABOLIC PANEL - Abnormal; Notable for the following components:   Glucose, Bld 143 (*)    Total Protein 8.2 (*)    All other components within normal limits  URINALYSIS, COMPLETE (UACMP) WITH MICROSCOPIC - Abnormal; Notable for the following components:   Color, Urine AMBER (*)    APPearance CLEAR (*)    Protein, ur 30 (*)    Bacteria, UA FEW (*)    All other components within normal limits  C DIFFICILE QUICK SCREEN W PCR REFLEX  GASTROINTESTINAL PANEL BY PCR, STOOL (REPLACES STOOL CULTURE)  TROPONIN I   ____________________________________________  EKG  ED ECG REPORT I, Doran Stabler, the attending physician, personally viewed and interpreted this ECG.   Date: 11/18/2017  EKG Time: 1702  Rate: 149  Rhythm: EKG machine reads a sinus tachycardia.  However, I am suspecting atrial flutter.  Axis: Left axis  Intervals:none  ST&T Change: No ST segment elevations.  No ST depression.  T wave inversions in 1 as well as aVL. No previous for comparison.  ED ECG REPORT I, Doran Stabler, the attending physician, personally viewed and interpreted this ECG.   Date: 11/18/2017  EKG Time: 1728  Rate: 145  Rhythm: atrial fibrillation, rate 145  Axis: Normal  Intervals:none  ST&T Change: T wave inversions in 1 as well as aVL.  ST depression in V6 of  1 mm as well as minimal depression in V5.  Minimal depression as well in 1, 2 and aVL.  Suspect this is rate related.  ED ECG REPORT I, Doran Stabler, the attending physician, personally viewed and interpreted this ECG.   Date: 11/18/2017  EKG Time: 1817  Rate: 80  Rhythm: normal sinus rhythm with PACs  Axis: Normal  Intervals:none  ST&T Change: T wave inversions in 1 as well as aVL.  No ST elevations or depressions.  ______________________  ______________________  [id number]   ____________________________________________   PROCEDURES  Procedure(s) performed:   .Critical Care Performed by: Orbie Pyo, MD Authorized by: Orbie Pyo, MD   Critical care provider statement:    Critical care time (minutes):  35   Critical care was necessary  to treat or prevent imminent or life-threatening deterioration of the following conditions:  Cardiac failure   Critical care was time spent personally by me on the following activities:  Examination of patient, ordering and performing treatments and interventions, ordering and review of laboratory studies, ordering and review of radiographic studies and re-evaluation of patient's condition    Critical Care performed:   ____________________________________________   INITIAL IMPRESSION / ASSESSMENT AND PLAN / ED COURSE  Pertinent labs & imaging results that were available during my care of the patient were reviewed by me and considered in my medical decision making (see chart for details).  DDX: Diarrhea, dehydration, atrial fibrillation, atrial flutter, sinus tachycardia, infectious diarrhea, colitis As part of my medical decision making, I reviewed the following data within the [place] Notes from prior outpatient visits  Patient initially in atrial flutter and now appears to be in atrial fibrillation.  This is new onset.  We will give fluids as well as give a bolus of Cardizem and  reassess.  ----------------------------------------- 6:22 PM on 11/18/2017 -----------------------------------------  Patient now with heart rate of 75 and a normal sinus rhythm with occasional PACs.  Discussed the case with Dr. Ubaldo Glassing who agrees with 12.5 mg of metoprolol at this time.  Plan for follow-up in the office.  Dr. Ubaldo Glassing says to not start the patient on any anticoagulation at this time.  ----------------------------------------- 11:25 PM on 11/18/2017 -----------------------------------------  Patient with negative C. difficile.  Remains in sinus rhythm.  Patient says that he has had 6-7 episodes of diarrhea persistently in the emergency department.  I will discharge him with Lomotil and addition to the metoprolol.  He will be following up with cardiology.  He is understanding of the diagnosis as well as treatment plan willing to comply.  Vomited once to receive Zofran.  No further episodes of vomiting. ____________________________________________   FINAL CLINICAL IMPRESSION(S) / ED DIAGNOSES  Nausea vomiting and diarrhea.  Atrial flutter.  Atrial for ablation.    NEW MEDICATIONS STARTED DURING THIS VISIT:  New Prescriptions   No medications on file     Note:  This document was prepared using Dragon voice recognition software and may include unintentional dictation errors.     Orbie Pyo, MD 11/18/17 2325    Orbie Pyo, MD 11/27/17 [phone]

## 2017-11-18 NOTE — ED Notes (Signed)
repeat ekg done  afib on monitor.  Skin warm and dry  Iv in place.

## 2017-11-18 NOTE — ED Notes (Signed)
mrds given   No chest pain or sob

## 2017-11-19 NOTE — ED Notes (Signed)

## 2017-12-14 DIAGNOSIS — I480 Paroxysmal atrial fibrillation: Secondary | ICD-10-CM | POA: Diagnosis not present

## 2017-12-14 DIAGNOSIS — I10 Essential (primary) hypertension: Secondary | ICD-10-CM | POA: Diagnosis not present

## 2017-12-14 DIAGNOSIS — E782 Mixed hyperlipidemia: Secondary | ICD-10-CM | POA: Diagnosis not present

## 2018-01-29 ENCOUNTER — Encounter: Payer: Self-pay | Admitting: Emergency Medicine

## 2018-01-29 ENCOUNTER — Emergency Department: Payer: PPO

## 2018-01-29 ENCOUNTER — Other Ambulatory Visit: Payer: Self-pay

## 2018-01-29 ENCOUNTER — Emergency Department
Admission: EM | Admit: 2018-01-29 | Discharge: 2018-01-29 | Disposition: A | Discharge status: Home / Self Care | Payer: PPO | Attending: Emergency Medicine | Admitting: Emergency Medicine

## 2018-01-29 DIAGNOSIS — Z7982 Long term (current) use of aspirin: Secondary | ICD-10-CM | POA: Diagnosis not present

## 2018-01-29 DIAGNOSIS — Z79899 Other long term (current) drug therapy: Secondary | ICD-10-CM | POA: Insufficient documentation

## 2018-01-29 DIAGNOSIS — I4891 Unspecified atrial fibrillation: Secondary | ICD-10-CM | POA: Diagnosis not present

## 2018-01-29 DIAGNOSIS — Z87891 Personal history of nicotine dependence: Secondary | ICD-10-CM | POA: Diagnosis not present

## 2018-01-29 DIAGNOSIS — Z8546 Personal history of malignant neoplasm of prostate: Secondary | ICD-10-CM | POA: Diagnosis not present

## 2018-01-29 DIAGNOSIS — I480 Paroxysmal atrial fibrillation: Secondary | ICD-10-CM | POA: Insufficient documentation

## 2018-01-29 DIAGNOSIS — R0789 Other chest pain: Secondary | ICD-10-CM | POA: Insufficient documentation

## 2018-01-29 DIAGNOSIS — I10 Essential (primary) hypertension: Secondary | ICD-10-CM | POA: Diagnosis not present

## 2018-01-29 DIAGNOSIS — Z7901 Long term (current) use of anticoagulants: Secondary | ICD-10-CM | POA: Diagnosis not present

## 2018-01-29 DIAGNOSIS — Z91040 Latex allergy status: Secondary | ICD-10-CM | POA: Insufficient documentation

## 2018-01-29 DIAGNOSIS — R002 Palpitations: Secondary | ICD-10-CM | POA: Diagnosis present

## 2018-01-29 DIAGNOSIS — R918 Other nonspecific abnormal finding of lung field: Secondary | ICD-10-CM | POA: Diagnosis not present

## 2018-01-29 HISTORY — DX: Unspecified atrial fibrillation: I48.91

## 2018-01-29 LAB — BASIC METABOLIC PANEL
ANION GAP: 8 (ref 5–15)
BUN: 15 mg/dL (ref 8–23)
CALCIUM: 9 mg/dL (ref 8.9–10.3)
CO2: 27 mmol/L (ref 22–32)
Chloride: 106 mmol/L (ref 98–111)
Creatinine, Ser: 0.94 mg/dL (ref 0.61–1.24)
GFR calc Af Amer: >60 mL/min (ref >60)
GFR calc non Af Amer: >60 mL/min (ref >60)
GLUCOSE: 122 mg/dL — AB (ref 70–99)
POTASSIUM: 3.9 mmol/L (ref 3.5–5.1)
Sodium: 141 mmol/L (ref 135–145)

## 2018-01-29 LAB — CBC
HEMATOCRIT: 44.5 % (ref 40.0–52.0)
HEMOGLOBIN: 15.2 g/dL (ref 13.0–18.0)
MCH: 30.8 pg (ref 26.0–34.0)
MCHC: 34.2 g/dL (ref 32.0–36.0)
MCV: 90.1 fL (ref 80.0–100.0)
Platelets: 214 10*3/uL (ref 150–440)
RBC: 4.93 MIL/uL (ref 4.40–5.90)
RDW: 13.7 % (ref 11.5–14.5)
WBC: 7.3 10*3/uL (ref 3.8–10.6)

## 2018-01-29 LAB — PROTIME-INR
INR: 1.07
Prothrombin Time: 13.8 seconds (ref 11.4–15.2)

## 2018-01-29 LAB — APTT: APTT: 40 s — AB (ref 24–36)

## 2018-01-29 LAB — TROPONIN I

## 2018-01-29 LAB — MAGNESIUM: MAGNESIUM: 2 mg/dL (ref 1.7–2.4)

## 2018-01-29 MED ORDER — METOPROLOL TARTRATE 25 MG PO TABS
25.0000 mg | ORAL_TABLET | Freq: Once | ORAL | Status: AC
  Administered 2018-01-29: 25 mg via ORAL
  Filled 2018-01-29: qty 1

## 2018-01-29 MED ORDER — METOPROLOL TARTRATE 25 MG PO TABS: 25.0000 mg | ORAL_TABLET | Freq: Two times a day (BID) | ORAL | 2 refills | Status: DC

## 2018-01-29 MED ORDER — APIXABAN 5 MG PO TABS
5.0000 mg | ORAL_TABLET | ORAL | Status: AC
  Administered 2018-01-29: 5 mg via ORAL
  Filled 2018-01-29: qty 1

## 2018-01-29 MED ORDER — APIXABAN 5 MG PO TABS: 5.0000 mg | ORAL_TABLET | Freq: Two times a day (BID) | ORAL | 2 refills | Status: DC

## 2018-01-29 MED ORDER — DILTIAZEM HCL 25 MG/5ML IV SOLN
15.0000 mg | INTRAVENOUS | Status: AC
  Administered 2018-01-29: 15 mg via INTRAVENOUS
  Filled 2018-01-29: qty 5

## 2018-01-29 NOTE — ED Triage Notes (Signed)
Pt to ED via POV c/o rapid heart rate. Pt states that he has hx/o A fib and that since around 2 pm he has felt like his heart rate has been elevated. Pt states that HR was been in the 160's. Pt reports some dizziness and pain in the left arm that radiates into the left side of the neck. Pt is in NAD at this time.

## 2018-01-29 NOTE — Discharge Instructions (Addendum)
As we discussed, your atrial fibrillation has persisted after treatment although your rate is better controlled now.  As per our conversation with Dr. Clayborn Bigness, we recommend that you take metoprolol tartrate 25 mg twice a day.  You can also take an extra dose of the metoprolol if you are feeling palpitations and your watch indicates an elevated rate, usually 120 bpm or greater.  If you can, check your blood pressure before taking an extra dose to make sure that the systolic (top number) is greater than 100.  We also prescribed for you a medication called Eliquis, a blood thinner which can be important for patients with atrial fibrillation to keep from developing blood clots in your heart.  We gave you a first dose and recommend that you take it twice daily, at least until you follow-up with Dr. Ubaldo Glassing.  Most of the time people stop taking aspirin when taking this medication, although Dr. Clayborn Bigness said that you can continue taking the low-dose aspirin if you prefer.  Many things can lead to development of atrial fibrillation including dehydration, so it is important that you drink plenty of fluids and avoid alcohol.  Try to get plenty of sleep and take all of your regular medications except as described above.  Return to the emergency department if you develop new or worsening symptoms that concern you.

## 2018-01-29 NOTE — ED Notes (Signed)
Patient taken to imaging. 

## 2018-01-29 NOTE — ED Provider Notes (Addendum)
[facility] Emergency Department Provider Note  ____________________________________________   First MD Initiated Contact with Patient 01/29/18 1830     (approximate)  I have reviewed the triage vital signs and the nursing notes.   HISTORY  Chief Complaint Palpitations    HPI John Marks is a 77 y.o. male who is quite healthy for his age but does have a history of one episode of paroxysmal atrial fibrillation for which she takes metoprolol tartrate 12.5 milligrams twice daily and a daily baby aspirin.  His cardiologist is Dr. Ubaldo Glassing.  He presents today for evaluation of palpitations and rapid heart rate.  He has a watch that monitors his heart rate and rhythm and at 2:00 PM he acutely went into A. fib with RVR from a normal sinus rhythm.  He has had some palpitations and at one point when his heart rate was in the 160s he had some left-sided chest pressure that radiated into the left side of his jaw, but generally he is asymptomatic except for the sensation of palpitations.  He has had no shortness of breath.  He denies any recent illnesses including fever/chills, cough, difficulty breathing, nausea, vomiting, and abdominal pain.  In fact he played golf this morning and then was resting after lunch when the A. fib started.  He has been eating and drinking well lately and tomorrow is scheduled to go to [place] on a fishing trip with a physician friend of his.  His heart rate is currently in the 120s to 140s but he rates his symptoms as mild.  Past Medical History:  Diagnosis Date  . Atrial fibrillation ([place])   . DDD (degenerative disc disease), cervical   . Hyperlipidemia   . Hypertension   . Prostate cancer ([place])     There are no active problems to display for this patient.   Past Surgical History:  Procedure Laterality Date  . COLONOSCOPY WITH PROPOFOL N/A 08/01/2015   Procedure: COLONOSCOPY WITH PROPOFOL;  Surgeon: Manya Silvas, MD;  Location[facility]  ENDOSCOPY;  Service: Endoscopy;  Laterality: N/A;  . PERINEAL PROSTATECTOMY      Prior to Admission medications   Medication Sig Start Date End Date Taking? Authorizing Provider  aspirin 81 MG tablet Take 81 mg by mouth daily.   Yes [provider]  atorvastatin (LIPITOR) 40 MG tablet Take 40 mg by mouth daily.   Yes [provider]  Choline Fenofibrate 135 MG capsule Take 135 mg by mouth daily.   Yes [provider]  diphenoxylate-atropine (LOMOTIL) 2.5-0.025 MG tablet Take 1 tablet by mouth 4 (four) times daily as needed for diarrhea or loose stools. 11/18/17 11/18/18 Yes Schaevitz, Randall An, MD  apixaban (ELIQUIS) 5 MG TABS tablet Take 1 tablet (5 mg total) by mouth 2 (two) times daily. 01/29/18   Hinda Kehr, MD  metoprolol tartrate (LOPRESSOR) 25 MG tablet Take 1 tablet (25 mg total) by mouth 2 (two) times daily. 01/29/18 01/29/19  Hinda Kehr, MD  ondansetron (ZOFRAN) 4 MG tablet Take 1 tablet (4 mg total) by mouth daily as needed. Patient not taking: Reported on 01/29/2018 11/18/17   Schaevitz, Randall An, MD    Allergies Latex  No family history on file.  Social History Social History   Tobacco Use  . Smoking status: Former Smoker    Last attempt to quit: 07/21/1987    Years since quitting: 30.5  . Smokeless tobacco: Never Used  Substance Use Topics  . Alcohol use: Yes  Alcohol/week: 0.6 oz    Types: 1 Glasses of wine per week  . Drug use: No    Review of Systems Constitutional: No fever/chills Eyes: No visual changes. ENT: No sore throat. Cardiovascular: Palpitations, brief rate related chest pain. Respiratory: Denies shortness of breath.  No recent cough. Gastrointestinal: No abdominal pain.  No nausea, no vomiting.  No diarrhea.  No constipation. Genitourinary: Negative for dysuria. Musculoskeletal: Negative for neck pain.  Negative for back pain. Integumentary: Negative for rash. Neurological: Negative for headaches, focal  weakness or numbness.  ____________________________________________   PHYSICAL EXAM:  VITAL SIGNS: ED Triage Vitals  Enc Vitals Group     BP 01/29/18 1842 (!) 151/109     Pulse Rate 01/29/18 1842 (!) 106     Resp 01/29/18 1842 18     Temp --      Temp src --      SpO2 01/29/18 1842 95 %     Weight 01/29/18 1727 93 kg (205 lb)     Height 01/29/18 1727 1.829 m (6')     Head Circumference --      Peak Flow --      Pain Score 01/29/18 1728 6     Pain Loc --      Pain Edu? --      Excl. in [place]? --     Constitutional: Alert and oriented. Well appearing and in no acute distress.  Healthy and well-appearing, active for his age. Eyes: Conjunctivae are normal.  Head: Atraumatic. Nose: No congestion/rhinnorhea. Mouth/Throat: Mucous membranes are moist. Neck: No stridor.  No meningeal signs.   Cardiovascular: Irregularly irregular rhythm with tachycardia. Good peripheral circulation. Grossly normal heart sounds. Respiratory: Normal respiratory effort.  No retractions. Lungs CTAB. Gastrointestinal: Soft and nontender. No distention.  Musculoskeletal: No lower extremity tenderness nor edema. No gross deformities of extremities. Neurologic:  Normal speech and language. No gross focal neurologic deficits are appreciated.  Skin:  Skin is warm, dry and intact. No rash noted. Psychiatric: Mood and affect are normal. Speech and behavior are normal.  ____________________________________________   LABS (all labs ordered are listed, but only abnormal results are displayed)  Labs Reviewed  BASIC METABOLIC PANEL - Abnormal; Notable for the following components:      Result Value   Glucose, Bld 122 (*)    All other components within normal limits  APTT - Abnormal; Notable for the following components:   aPTT 40 (*)    All other components within normal limits  CBC  TROPONIN I  MAGNESIUM  PROTIME-INR   ____________________________________________  EKG  ED ECG REPORT #1 I, Hinda Kehr, the attending physician, personally viewed and interpreted this ECG.  Date: 01/29/2018 EKG Time: 17: 24 Rate: 121 Rhythm: A. fib with RVR QRS Axis: normal Intervals: Abnormal secondary to A. fib, borderline LVH ST/T Wave abnormalities: Non-specific ST segment / T-wave changes, but no evidence of acute ischemia. Narrative Interpretation: no definitive evidence of acute ischemia  ED ECG REPORT #2 I, Hinda Kehr, the attending physician, personally viewed and interpreted this ECG.  Date: 01/29/2018 EKG Time: 19: 01 (after diltiazem 15 mg IV) Rate: 96 Rhythm: Atrial fibrillation QRS Axis: normal Intervals: Abnormal due to atrial fibrillation, otherwise unremarkable ST/T Wave abnormalities: No evidence of acute ischemia Narrative Interpretation: no evidence of acute ischemia    ____________________________________________  RADIOLOGY I, Hinda Kehr, personally viewed and evaluated these images (plain radiographs) as part of my medical decision making, as well as reviewing the written report  by the radiologist.  ED MD interpretation: The radiologist points out some possible bilateral lower lobe opacities but there is no clinical correlation with the symptoms.  Official radiology report(s): Dg Chest 2 View  Result Date: 01/29/2018 CLINICAL DATA:  Pt to ED via POV c/o rapid heart rate. Pt states that he has hx/o A fib and that since around 2 pm he has felt like his heart rate has been elevated. Pt states that HR was been in the 160's. EXAM: CHEST - 2 VIEW COMPARISON:  Thoracic spine films 05/25/2010 FINDINGS: Heart size is normal. There is mild perihilar peribronchial thickening. Patchy opacity is identified at the lung bases bilaterally, RIGHT greater than LEFT, chronicity not known. No pulmonary edema. No pleural effusions. Midthoracic spondylosis. IMPRESSION: Bilateral LOWER lobe opacities possibly representing early infiltrate. No pulmonary edema. Electronically Signed   By:  Nolon Nations M.D.   On: 01/29/2018 18:35    ____________________________________________   PROCEDURES  Critical Care performed: Yes, see critical care procedure note(s)   Procedure(s) performed:   .Critical Care Performed by: Hinda Kehr, MD Authorized by: Hinda Kehr, MD   Critical care provider statement:    Critical care time (minutes):  30   Critical care time was exclusive of:  Separately billable procedures and treating other patients   Critical care was necessary to treat or prevent imminent or life-threatening deterioration of the following conditions:  Circulatory failure (A-FIB w/ RVR treatment)   Critical care was time spent personally by me on the following activities:  Development of treatment plan with patient or surrogate, discussions with consultants, evaluation of patient's response to treatment, examination of patient, obtaining history from patient or surrogate, ordering and performing treatments and interventions, ordering and review of laboratory studies, ordering and review of radiographic studies, pulse oximetry, re-evaluation of patient's condition and review of old charts     ____________________________________________   INITIAL IMPRESSION / ASSESSMENT AND PLAN / ED COURSE  As part of my medical decision making, I reviewed the following data within the [facility] notes reviewed and incorporated, Labs reviewed , EKG interpreted , Old chart reviewed and Discussed with cardiologist (Dr. Clayborn Bigness).   Differential diagnosis includes, but is not limited to, A. fib with RVR of indeterminate cause, infectious process, electrolyte or metabolic abnormality, ACS.  The patient is very comfortable and in no acute distress with stable vital signs other than the A. fib with RVR itself.  He has a normal and in fact slightly elevated blood pressure.  His lab results are reassuring with no abnormalities identified on his basic metabolic  panel, magnesium, CBC, negative troponin, or coags (other than a very slightly elevated APTT at 40).  To treat his rate, I administered diltiazem 15 mg IV which successfully lowered his rate down to the 70s and had a concurrent improvement in his symptoms; he no longer felt any palpitations.  I called to discuss the case by phone with Dr. Clayborn Bigness after reviewing the medical record including the patient's most recent cardiology visit with Dr. Ubaldo Glassing about 2 months ago.  He is not currently on anticoagulation other than a low-dose aspirin and I discussed this with Dr. Clayborn Bigness as well.  Dr. Clayborn Bigness recommended increasing the dose of metoprolol to metoprolol tartrate 25 mg by mouth twice daily.  We also discussed the anticoagulation issue and he and I both agree that the patient should probably start on Eliquis 5 mg p.o. twice daily, at least until he can follow-up with Dr.  Fath.  Dr. Clayborn Bigness advised that he can continue taking a low-dose aspirin or not, either one is okay.  Dr. Clayborn Bigness and I both also agreed that the patient should be able to go ahead and go on his trip but I did give him strict precautions should he develop any new or worsening symptoms.  He was given a first dose of Eliquis in the emergency department given the relatively late hour as well as a dose of metoprolol tartrate 25 mg.  I also advised him he can take an additional 25 mg of metoprolol as needed for his symptoms if his blood pressures greater than 665 systolic.  He understands and agrees with the plan and will return if needed to the nearest emergency department.     ____________________________________________  FINAL CLINICAL IMPRESSION(S) / ED DIAGNOSES  Final diagnoses:  Atrial fibrillation with rapid ventricular response ([place])     MEDICATIONS GIVEN DURING THIS VISIT:  Medications  diltiazem (CARDIZEM) injection 15 mg (15 mg Intravenous Given 01/29/18 1849)  metoprolol tartrate (LOPRESSOR) tablet 25 mg (25 mg Oral  Given 01/29/18 1923)  apixaban (ELIQUIS) tablet 5 mg (5 mg Oral Given 01/29/18 1923)     ED Discharge Orders        Ordered    apixaban (ELIQUIS) 5 MG TABS tablet  2 times daily     01/29/18 1926    metoprolol tartrate (LOPRESSOR) 25 MG tablet  2 times daily     01/29/18 1926       Note:  This document was prepared using Dragon voice recognition software and may include unintentional dictation errors.    Hinda Kehr, MD 01/29/18 Blanchie Serve    Hinda Kehr, MD 01/29/18 [phone]

## 2018-02-09 DIAGNOSIS — I10 Essential (primary) hypertension: Secondary | ICD-10-CM | POA: Diagnosis not present

## 2018-02-09 DIAGNOSIS — I480 Paroxysmal atrial fibrillation: Secondary | ICD-10-CM | POA: Diagnosis not present

## 2018-02-09 DIAGNOSIS — E782 Mixed hyperlipidemia: Secondary | ICD-10-CM | POA: Diagnosis not present

## 2018-02-16 DIAGNOSIS — I480 Paroxysmal atrial fibrillation: Secondary | ICD-10-CM | POA: Diagnosis not present

## 2018-03-03 DIAGNOSIS — E782 Mixed hyperlipidemia: Secondary | ICD-10-CM | POA: Diagnosis not present

## 2018-03-03 DIAGNOSIS — Z125 Encounter for screening for malignant neoplasm of prostate: Secondary | ICD-10-CM | POA: Diagnosis not present

## 2018-03-03 DIAGNOSIS — Z79899 Other long term (current) drug therapy: Secondary | ICD-10-CM | POA: Diagnosis not present

## 2018-03-03 DIAGNOSIS — I10 Essential (primary) hypertension: Secondary | ICD-10-CM | POA: Diagnosis not present

## 2018-03-03 DIAGNOSIS — I480 Paroxysmal atrial fibrillation: Secondary | ICD-10-CM | POA: Diagnosis not present

## 2018-03-03 DIAGNOSIS — Z0000 Encounter for general adult medical examination without abnormal findings: Secondary | ICD-10-CM | POA: Diagnosis not present

## 2018-03-07 DIAGNOSIS — I10 Essential (primary) hypertension: Secondary | ICD-10-CM | POA: Diagnosis not present

## 2018-03-07 DIAGNOSIS — Z125 Encounter for screening for malignant neoplasm of prostate: Secondary | ICD-10-CM | POA: Diagnosis not present

## 2018-03-07 DIAGNOSIS — Z79899 Other long term (current) drug therapy: Secondary | ICD-10-CM | POA: Diagnosis not present

## 2018-03-07 DIAGNOSIS — E782 Mixed hyperlipidemia: Secondary | ICD-10-CM | POA: Diagnosis not present

## 2018-04-04 DIAGNOSIS — D2261 Melanocytic nevi of right upper limb, including shoulder: Secondary | ICD-10-CM | POA: Diagnosis not present

## 2018-04-04 DIAGNOSIS — Z85828 Personal history of other malignant neoplasm of skin: Secondary | ICD-10-CM | POA: Diagnosis not present

## 2018-04-04 DIAGNOSIS — L570 Actinic keratosis: Secondary | ICD-10-CM | POA: Diagnosis not present

## 2018-04-04 DIAGNOSIS — L821 Other seborrheic keratosis: Secondary | ICD-10-CM | POA: Diagnosis not present

## 2018-04-04 DIAGNOSIS — D2271 Melanocytic nevi of right lower limb, including hip: Secondary | ICD-10-CM | POA: Diagnosis not present

## 2018-04-04 DIAGNOSIS — D225 Melanocytic nevi of trunk: Secondary | ICD-10-CM | POA: Diagnosis not present

## 2018-04-04 DIAGNOSIS — Z08 Encounter for follow-up examination after completed treatment for malignant neoplasm: Secondary | ICD-10-CM | POA: Diagnosis not present

## 2018-04-04 DIAGNOSIS — D2272 Melanocytic nevi of left lower limb, including hip: Secondary | ICD-10-CM | POA: Diagnosis not present

## 2018-04-04 DIAGNOSIS — X32XXXA Exposure to sunlight, initial encounter: Secondary | ICD-10-CM | POA: Diagnosis not present

## 2018-04-04 DIAGNOSIS — D2262 Melanocytic nevi of left upper limb, including shoulder: Secondary | ICD-10-CM | POA: Diagnosis not present

## 2018-04-07 DIAGNOSIS — H5203 Hypermetropia, bilateral: Secondary | ICD-10-CM | POA: Diagnosis not present

## 2018-04-07 DIAGNOSIS — H02834 Dermatochalasis of left upper eyelid: Secondary | ICD-10-CM | POA: Diagnosis not present

## 2018-04-07 DIAGNOSIS — H2513 Age-related nuclear cataract, bilateral: Secondary | ICD-10-CM | POA: Diagnosis not present

## 2018-04-07 DIAGNOSIS — H02831 Dermatochalasis of right upper eyelid: Secondary | ICD-10-CM | POA: Diagnosis not present

## 2018-04-07 DIAGNOSIS — H524 Presbyopia: Secondary | ICD-10-CM | POA: Diagnosis not present

## 2018-07-25 ENCOUNTER — Other Ambulatory Visit: Payer: Self-pay | Admitting: Physician Assistant

## 2018-07-25 ENCOUNTER — Other Ambulatory Visit (HOSPITAL_COMMUNITY): Payer: Self-pay | Admitting: Physician Assistant

## 2018-07-25 DIAGNOSIS — M25561 Pain in right knee: Secondary | ICD-10-CM | POA: Diagnosis not present

## 2018-07-25 DIAGNOSIS — M2391 Unspecified internal derangement of right knee: Secondary | ICD-10-CM | POA: Diagnosis not present

## 2018-07-25 DIAGNOSIS — M25361 Other instability, right knee: Secondary | ICD-10-CM | POA: Diagnosis not present

## 2018-07-28 ENCOUNTER — Ambulatory Visit
Admission: RE | Admit: 2018-07-28 | Discharge: 2018-07-28 | Disposition: A | Discharge status: Home / Self Care | Payer: PPO | Source: Ambulatory Visit | Attending: Physician Assistant | Admitting: Physician Assistant

## 2018-07-28 DIAGNOSIS — M25361 Other instability, right knee: Secondary | ICD-10-CM | POA: Insufficient documentation

## 2018-07-28 DIAGNOSIS — M25561 Pain in right knee: Secondary | ICD-10-CM | POA: Diagnosis not present

## 2018-08-03 ENCOUNTER — Ambulatory Visit: Payer: PPO

## 2018-08-03 DIAGNOSIS — I10 Essential (primary) hypertension: Secondary | ICD-10-CM | POA: Diagnosis not present

## 2018-08-03 DIAGNOSIS — I480 Paroxysmal atrial fibrillation: Secondary | ICD-10-CM | POA: Diagnosis not present

## 2018-08-04 ENCOUNTER — Ambulatory Visit: Payer: PPO

## 2018-08-12 DIAGNOSIS — M1711 Unilateral primary osteoarthritis, right knee: Secondary | ICD-10-CM | POA: Diagnosis not present

## 2018-08-13 DIAGNOSIS — J019 Acute sinusitis, unspecified: Secondary | ICD-10-CM | POA: Diagnosis not present

## 2018-08-19 DIAGNOSIS — M1711 Unilateral primary osteoarthritis, right knee: Secondary | ICD-10-CM | POA: Diagnosis not present

## 2018-08-19 DIAGNOSIS — G8929 Other chronic pain: Secondary | ICD-10-CM | POA: Diagnosis not present

## 2018-08-19 DIAGNOSIS — M25461 Effusion, right knee: Secondary | ICD-10-CM | POA: Diagnosis not present

## 2018-08-19 DIAGNOSIS — M25561 Pain in right knee: Secondary | ICD-10-CM | POA: Diagnosis not present

## 2018-08-26 DIAGNOSIS — G8929 Other chronic pain: Secondary | ICD-10-CM | POA: Diagnosis not present

## 2018-08-26 DIAGNOSIS — M25561 Pain in right knee: Secondary | ICD-10-CM | POA: Diagnosis not present

## 2018-08-26 DIAGNOSIS — M25461 Effusion, right knee: Secondary | ICD-10-CM | POA: Diagnosis not present

## 2018-08-26 DIAGNOSIS — M1711 Unilateral primary osteoarthritis, right knee: Secondary | ICD-10-CM | POA: Diagnosis not present

## 2018-08-31 DIAGNOSIS — Z79899 Other long term (current) drug therapy: Secondary | ICD-10-CM | POA: Diagnosis not present

## 2018-08-31 DIAGNOSIS — E782 Mixed hyperlipidemia: Secondary | ICD-10-CM | POA: Diagnosis not present

## 2018-08-31 DIAGNOSIS — I480 Paroxysmal atrial fibrillation: Secondary | ICD-10-CM | POA: Diagnosis not present

## 2018-08-31 DIAGNOSIS — Z0000 Encounter for general adult medical examination without abnormal findings: Secondary | ICD-10-CM | POA: Diagnosis not present

## 2018-08-31 DIAGNOSIS — Z125 Encounter for screening for malignant neoplasm of prostate: Secondary | ICD-10-CM | POA: Diagnosis not present

## 2018-08-31 DIAGNOSIS — I10 Essential (primary) hypertension: Secondary | ICD-10-CM | POA: Diagnosis not present

## 2018-09-01 DIAGNOSIS — E782 Mixed hyperlipidemia: Secondary | ICD-10-CM | POA: Diagnosis not present

## 2018-09-01 DIAGNOSIS — Z79899 Other long term (current) drug therapy: Secondary | ICD-10-CM | POA: Diagnosis not present

## 2018-09-01 DIAGNOSIS — Z125 Encounter for screening for malignant neoplasm of prostate: Secondary | ICD-10-CM | POA: Diagnosis not present

## 2018-09-01 DIAGNOSIS — I10 Essential (primary) hypertension: Secondary | ICD-10-CM | POA: Diagnosis not present

## 2018-09-30 DIAGNOSIS — M76891 Other specified enthesopathies of right lower limb, excluding foot: Secondary | ICD-10-CM | POA: Diagnosis not present

## 2018-09-30 DIAGNOSIS — M25561 Pain in right knee: Secondary | ICD-10-CM | POA: Diagnosis not present

## 2018-09-30 DIAGNOSIS — M1711 Unilateral primary osteoarthritis, right knee: Secondary | ICD-10-CM | POA: Diagnosis not present

## 2018-09-30 DIAGNOSIS — G8929 Other chronic pain: Secondary | ICD-10-CM | POA: Diagnosis not present

## 2018-09-30 DIAGNOSIS — M25461 Effusion, right knee: Secondary | ICD-10-CM | POA: Diagnosis not present

## 2018-12-07 DIAGNOSIS — M1811 Unilateral primary osteoarthritis of first carpometacarpal joint, right hand: Secondary | ICD-10-CM | POA: Diagnosis not present

## 2018-12-07 DIAGNOSIS — M1711 Unilateral primary osteoarthritis, right knee: Secondary | ICD-10-CM | POA: Diagnosis not present

## 2019-01-03 DIAGNOSIS — Z1159 Encounter for screening for other viral diseases: Secondary | ICD-10-CM | POA: Diagnosis not present

## 2019-01-31 DIAGNOSIS — E782 Mixed hyperlipidemia: Secondary | ICD-10-CM | POA: Diagnosis not present

## 2019-01-31 DIAGNOSIS — I10 Essential (primary) hypertension: Secondary | ICD-10-CM | POA: Diagnosis not present

## 2019-01-31 DIAGNOSIS — I480 Paroxysmal atrial fibrillation: Secondary | ICD-10-CM | POA: Diagnosis not present

## 2019-03-09 DIAGNOSIS — E782 Mixed hyperlipidemia: Secondary | ICD-10-CM | POA: Diagnosis not present

## 2019-03-09 DIAGNOSIS — Z0000 Encounter for general adult medical examination without abnormal findings: Secondary | ICD-10-CM | POA: Diagnosis not present

## 2019-03-09 DIAGNOSIS — I10 Essential (primary) hypertension: Secondary | ICD-10-CM | POA: Diagnosis not present

## 2019-03-09 DIAGNOSIS — R0609 Other forms of dyspnea: Secondary | ICD-10-CM | POA: Diagnosis not present

## 2019-03-09 DIAGNOSIS — R0600 Dyspnea, unspecified: Secondary | ICD-10-CM | POA: Diagnosis not present

## 2019-03-09 DIAGNOSIS — I480 Paroxysmal atrial fibrillation: Secondary | ICD-10-CM | POA: Diagnosis not present

## 2019-03-09 DIAGNOSIS — Z125 Encounter for screening for malignant neoplasm of prostate: Secondary | ICD-10-CM | POA: Diagnosis not present

## 2019-03-09 DIAGNOSIS — Z79899 Other long term (current) drug therapy: Secondary | ICD-10-CM | POA: Diagnosis not present

## 2019-03-23 DIAGNOSIS — Z79899 Other long term (current) drug therapy: Secondary | ICD-10-CM | POA: Diagnosis not present

## 2019-03-23 DIAGNOSIS — Z125 Encounter for screening for malignant neoplasm of prostate: Secondary | ICD-10-CM | POA: Diagnosis not present

## 2019-03-23 DIAGNOSIS — R0600 Dyspnea, unspecified: Secondary | ICD-10-CM | POA: Diagnosis not present

## 2019-03-23 DIAGNOSIS — E782 Mixed hyperlipidemia: Secondary | ICD-10-CM | POA: Diagnosis not present

## 2019-03-23 DIAGNOSIS — Z8546 Personal history of malignant neoplasm of prostate: Secondary | ICD-10-CM | POA: Diagnosis not present

## 2019-03-23 DIAGNOSIS — I10 Essential (primary) hypertension: Secondary | ICD-10-CM | POA: Diagnosis not present

## 2019-04-05 DIAGNOSIS — X32XXXA Exposure to sunlight, initial encounter: Secondary | ICD-10-CM | POA: Diagnosis not present

## 2019-04-05 DIAGNOSIS — D2271 Melanocytic nevi of right lower limb, including hip: Secondary | ICD-10-CM | POA: Diagnosis not present

## 2019-04-05 DIAGNOSIS — D2261 Melanocytic nevi of right upper limb, including shoulder: Secondary | ICD-10-CM | POA: Diagnosis not present

## 2019-04-05 DIAGNOSIS — Z08 Encounter for follow-up examination after completed treatment for malignant neoplasm: Secondary | ICD-10-CM | POA: Diagnosis not present

## 2019-04-05 DIAGNOSIS — Z85828 Personal history of other malignant neoplasm of skin: Secondary | ICD-10-CM | POA: Diagnosis not present

## 2019-04-05 DIAGNOSIS — D2262 Melanocytic nevi of left upper limb, including shoulder: Secondary | ICD-10-CM | POA: Diagnosis not present

## 2019-04-05 DIAGNOSIS — L821 Other seborrheic keratosis: Secondary | ICD-10-CM | POA: Diagnosis not present

## 2019-04-05 DIAGNOSIS — D225 Melanocytic nevi of trunk: Secondary | ICD-10-CM | POA: Diagnosis not present

## 2019-04-05 DIAGNOSIS — L570 Actinic keratosis: Secondary | ICD-10-CM | POA: Diagnosis not present

## 2019-04-05 DIAGNOSIS — D2272 Melanocytic nevi of left lower limb, including hip: Secondary | ICD-10-CM | POA: Diagnosis not present

## 2019-04-05 DIAGNOSIS — L565 Disseminated superficial actinic porokeratosis (DSAP): Secondary | ICD-10-CM | POA: Diagnosis not present

## 2019-04-10 DIAGNOSIS — H02831 Dermatochalasis of right upper eyelid: Secondary | ICD-10-CM | POA: Diagnosis not present

## 2019-04-10 DIAGNOSIS — H5203 Hypermetropia, bilateral: Secondary | ICD-10-CM | POA: Diagnosis not present

## 2019-04-10 DIAGNOSIS — H2513 Age-related nuclear cataract, bilateral: Secondary | ICD-10-CM | POA: Diagnosis not present

## 2019-04-10 DIAGNOSIS — H524 Presbyopia: Secondary | ICD-10-CM | POA: Diagnosis not present

## 2019-04-10 DIAGNOSIS — H02834 Dermatochalasis of left upper eyelid: Secondary | ICD-10-CM | POA: Diagnosis not present

## 2019-05-21 HISTORY — PX: OTHER SURGICAL HISTORY: SHX169

## 2019-05-23 ENCOUNTER — Encounter: Payer: Self-pay | Admitting: Urology

## 2019-05-23 ENCOUNTER — Other Ambulatory Visit: Payer: Self-pay

## 2019-05-23 ENCOUNTER — Ambulatory Visit: Payer: PPO | Admitting: Urology

## 2019-05-23 VITALS — BP 142/80 | HR 72 | Ht 71.0 in | Wt 212.0 lb

## 2019-05-23 DIAGNOSIS — Z8546 Personal history of malignant neoplasm of prostate: Secondary | ICD-10-CM

## 2019-05-23 DIAGNOSIS — N5089 Other specified disorders of the male genital organs: Secondary | ICD-10-CM

## 2019-05-23 NOTE — Patient Instructions (Signed)
Hydrocele, Adult  A hydrocele is a collection of fluid in the loose pouch of skin that holds the testicles (scrotum). This may happen because:  The amount of fluid produced in the scrotum is not absorbed by the rest of the body.  Fluid from the abdomen fills the scrotum. Normally, the testicles develop in the abdomen then move (drop) into to the scrotum before birth. The tube that the testicles travel through usually closes after the testicles drop. If the tube does not close, fluid from the abdomen can fill the scrotum. This is less common in adults.  What are the causes?  The cause of a hydrocele in adults is usually not known. However, it may be caused by:  An injury to the scrotum.  An infection (epididymitis).  Decreased blood flow to the scrotum.  Twisting of a testicle (testicular torsion).  A birth defect.  A tumor or cancer of the testicle.  What are the signs or symptoms?  A hydrocele feels like a water-filled balloon. It may also feel heavy. Other symptoms include:  Swelling of the scrotum. The swelling may decrease when you lie down. You may also notice more swelling at night than in the morning.  Swelling of the groin.  Mild discomfort in the scrotum.  Pain. This can develop if the hydrocele was caused by infection or twisting. The larger the hydrocele, the more likely you are to have pain.  How is this diagnosed?  This condition may be diagnosed based on:  Physical exam.  Medical history.  You may also have other tests, including:  Imaging tests, such as ultrasound.  Blood or urine tests.  How is this treated?  Most hydroceles go away on their own. If you have no discomfort or pain, your health care provider may suggest close monitoring of your condition (called watch and wait or watchful waiting) until the condition goes away or symptoms develop. If treatment is needed, it may include:  Treating an underlying condition. This may include using an antibiotic medicine to treat an infection.  Surgery to  stop fluid from collecting in the scrotum.  Surgery to drain the fluid. Options include:  Needle aspiration. A needle is used to drain fluid. However, the fluid buildup will come back quickly.  Hydrocelectomy. For this procedure, an incision is made in the scrotum to remove the fluid sac.  Follow these instructions at home:  Watch the hydrocele for any changes.  Take over-the-counter and prescription medicines only as told by your health care provider.  If you were prescribed an antibiotic medicine, use it as told by your health care provider. Do not stop taking the antibiotic even if you start to feel better.  Keep all follow-up visits as told by your health care provider. This is important.  Contact a health care provider if:  You notice any changes in the hydrocele.  The swelling in your scrotum or groin gets worse.  The hydrocele becomes red, firm, painful, or tender to the touch.  You have a fever.  Get help right away if you:  Develop a lot of pain, or your pain becomes worse.  Summary  A hydrocele is a collection of fluid in the loose pouch of skin that holds the testicles (scrotum).  Hydroceles can cause swelling, discomfort, and sometimes pain.  In adults, the cause of a hydrocele usually is not known. However, it is sometimes caused by an infection or a rotation and twisting of the scrotum.  Treatment is usually not   may be given to ease the pain. This information is not intended to replace advice given to you by your health care provider. Make sure you discuss any questions you have with your health care provider. Document Released: 12/24/2009 Document Revised: 07/17/2017 Document Reviewed: 07/17/2017 Elsevier Patient Education  2020 [place], Adult  A hydrocelectomy is a surgical procedure to remove a collection of fluid  (hydrocele) from the pouch that holds the testicles (scrotum). You may need to have a hydrocelectomy if a hydrocele is making your scrotum swell painfully. Tell a health care provider about:  Any allergies you have.  All medicines you are taking, including vitamins, herbs, eye drops, creams, and over-the-counter medicines.  Any problems you or family members have had with anesthetic medicines.  Any blood disorders you have.  Any surgeries you have had.  Any medical conditions you have. What are the risks? Generally this is a safe procedure. However, problems may occur, including:  Bleeding into the scrotum (scrotal hematoma).  Damage to the testicle or the tube that carries sperm out of the testicle (vas deferens).  Infection.  Allergic reactions to medicines. What happens before the procedure? Staying hydrated Follow instructions from your health care provider about hydration, which may include:  Up to 2 hours before the procedure - you may continue to drink clear liquids, such as water, clear fruit juice, black coffee, and plain tea. Eating and drinking restrictions Follow instructions from your health care provider about eating and drinking, which may include:  8 hours before the procedure - stop eating heavy meals or foods such as meat, fried foods, or fatty foods.  6 hours before the procedure - stop eating light meals or foods, such as toast or cereal.  6 hours before the procedure - stop drinking milk or drinks that contain milk.  2 hours before the procedure - stop drinking clear liquids. Medicines  Ask your health care provider about: ? Changing or stopping your regular medicines. This is especially important if you are taking diabetes medicines or blood thinners. ? Taking medicines such as aspirin and ibuprofen. These medicines can thin your blood. Do not take these medicines before your procedure if your health care provider instructs you not to.  You may be  given antibiotic medicine to help prevent infection. General instructions  Plan to have someone take you home after the procedure. What happens during the procedure?  To reduce your risk of infection: ? Your health care team will wash or sanitize their hands. ? A germ-killing solution (antiseptic) will be used to wash your scrotum and the area around it. Hair may be removed from this area.  An IV tube will be inserted into one of your veins.  You will be given one or more of the following: ? A medicine to make you relax (sedative). ? A medicine to make you fall asleep (general anesthetic).  A small incision will be made through the skin of your scrotum.  Your testicle and the hydrocele will be located, and the hydrocele sac will be opened with an incision.  The fluid will be drained from the hydrocele.  The hydrocele will be closed with absorbable stitches (sutures) to prevent fluid from building up again.  If you have a large hydrocele, a thin rubber drain may be placed to allow fluid to drain after the procedure.  The incision in your scrotum will be closed with absorbable sutures.  A bandage (dressing) will be placed over the incision.  The procedure may vary among health care providers and hospitals. What happens after the procedure?  Your blood pressure, heart rate, breathing rate, and blood oxygen level will be monitored until the medicines you were given have worn off.  You will be given pain medicine as needed.  Do not drive for 24 hours if you were given a sedative.  You may have to wear an athletic support strap to hold the dressing in place and support your scrotum. This information is not intended to replace advice given to you by your health care provider. Make sure you discuss any questions you have with your health care provider. Document Released: 03/27/2015 Document Revised: 06/18/2017 Document Reviewed: 04/04/2016 Elsevier Patient Education  2020 [employer].

## 2019-05-23 NOTE — Progress Notes (Signed)
05/23/19 8:58 AM   John Marks Sep 13, 1940 [id number]  Referring provider: Idelle Crouch, MD [place],  [place] [address]  CC: Bilateral scrotal swelling  HPI: I saw John Marks in urology clinic today in consultation from John Marks for bilateral scrotal swelling.  He is a 78 year old male with past medical history notable for atrial fibrillation on Eliquis, arthritis, hyperlipidemia, hypertension, and prostate cancer treated with open prostatectomy by John Marks in 2007.  His PSA has remained undetectable since that time.  He denies any urinary symptoms or gross hematuria.  He reports 2 weeks of severe scrotal swelling bilaterally with acute onset.  He denies any pain or difficulty with urination.  He denies any new medications or other changes in his health in the last 2 weeks.  He denies any weight loss or bone pain.  There are no aggravating or alleviating factors.  He thinks he had a inguinal hernia repaired as a kid on the right side, but he is not sure.   PMH: Past Medical History:  Diagnosis Date  . Acid reflux   . Arthritis   . Atrial fibrillation ([place])   . DDD (degenerative disc disease), cervical   . Hyperlipidemia   . Hypertension   . Prostate cancer [facility])     Surgical History: Past Surgical History:  Procedure Laterality Date  . COLONOSCOPY WITH PROPOFOL N/A 08/01/2015   Procedure: COLONOSCOPY WITH PROPOFOL;  Surgeon: John Silvas, MD;  Location: [facility] ENDOSCOPY;  Service: Endoscopy;  Laterality: N/A;  . PERINEAL PROSTATECTOMY      Allergies:  Allergies  Allergen Reactions  . Latex Itching    Family History: No family history on file.  Social History:  reports that he quit smoking about 31 years ago. He has never used smokeless tobacco. He reports current alcohol use of about 1.0 standard drinks of alcohol per week. He reports that he does not use drugs.  ROS: Please see flowsheet from today's date for  complete review of systems.  Physical Exam: BP (!) 142/80 (BP Location: Left Arm, Patient Position: Sitting, Cuff Size: Normal)   Pulse 72   Ht 5\' 11"  (1.803 m)   Wt 212 lb (96.2 kg)   BMI 29.57 kg/m    Constitutional:  Alert and oriented, No acute distress. Cardiovascular: No clubbing, cyanosis, or edema. Respiratory: Normal respiratory effort, no increased work of breathing. GI: Abdomen is soft, nontender, nondistended, no abdominal masses GU: Circumcised phallus with widely patent meatus, bilateral scrotal swelling consistent with likely bilateral hydroceles, unable to palpate testicles secondary to large hydroceles Lymph: No cervical or inguinal lymphadenopathy. Skin: No rashes, bruises or suspicious lesions. Neurologic: Grossly intact, no focal deficits, moving all 4 extremities. Psychiatric: Normal mood and affect.  Laboratory Data: PSA undetectable 2019  Pertinent Imaging: None to review  Assessment & Plan:   In summary, the patient is a 78 year old male with history of intermediate risk prostate cancer treated with prostatectomy in 2007 with undetectable PSA since that time, here today for bilateral scrotal swelling for 2 weeks.  His exam is limited due to his significant scrotal swelling, but is most consistent with bilateral hydroceles.  We discussed the need for scrotal ultrasound to rule out testicular pathology or inguinal hernia.  We also discussed management options for hydrocele including observation, aspiration, or hydrocelectomy.  We discussed that hydrocelectomy is a 45-minute surgery under general anesthesia with low risk of bleeding or infection, but is purely a quality of  life procedure, and if he is not bothered by the scrotal swelling no further intervention is needed.  Scrotal ultrasound, call with results No further PSA surveillance needed as > 10 years out from prostatectomy  John Marks, [place] [address]  [place], [place] [address] [phone]

## 2019-05-28 DIAGNOSIS — Z20828 Contact with and (suspected) exposure to other viral communicable diseases: Secondary | ICD-10-CM | POA: Diagnosis not present

## 2019-05-30 ENCOUNTER — Ambulatory Visit
Admission: RE | Admit: 2019-05-30 | Discharge: 2019-05-30 | Disposition: A | Discharge status: Home / Self Care | Payer: PPO | Source: Ambulatory Visit | Attending: Urology | Admitting: Urology

## 2019-05-30 ENCOUNTER — Other Ambulatory Visit: Payer: Self-pay

## 2019-05-30 DIAGNOSIS — N5089 Other specified disorders of the male genital organs: Secondary | ICD-10-CM | POA: Diagnosis not present

## 2019-05-30 DIAGNOSIS — N433 Hydrocele, unspecified: Secondary | ICD-10-CM | POA: Diagnosis not present

## 2019-06-06 DIAGNOSIS — L03012 Cellulitis of left finger: Secondary | ICD-10-CM | POA: Diagnosis not present

## 2019-06-06 DIAGNOSIS — M1711 Unilateral primary osteoarthritis, right knee: Secondary | ICD-10-CM | POA: Diagnosis not present

## 2019-06-08 DIAGNOSIS — E7800 Pure hypercholesterolemia, unspecified: Secondary | ICD-10-CM | POA: Diagnosis not present

## 2019-06-08 DIAGNOSIS — G8918 Other acute postprocedural pain: Secondary | ICD-10-CM | POA: Diagnosis not present

## 2019-06-08 DIAGNOSIS — M778 Other enthesopathies, not elsewhere classified: Secondary | ICD-10-CM | POA: Diagnosis not present

## 2019-06-08 DIAGNOSIS — I480 Paroxysmal atrial fibrillation: Secondary | ICD-10-CM | POA: Diagnosis not present

## 2019-06-08 DIAGNOSIS — M79645 Pain in left finger(s): Secondary | ICD-10-CM | POA: Diagnosis not present

## 2019-06-08 DIAGNOSIS — K5900 Constipation, unspecified: Secondary | ICD-10-CM | POA: Diagnosis not present

## 2019-06-08 DIAGNOSIS — L03012 Cellulitis of left finger: Secondary | ICD-10-CM | POA: Diagnosis not present

## 2019-06-08 DIAGNOSIS — K219 Gastro-esophageal reflux disease without esophagitis: Secondary | ICD-10-CM | POA: Diagnosis not present

## 2019-06-08 DIAGNOSIS — Z87891 Personal history of nicotine dependence: Secondary | ICD-10-CM | POA: Diagnosis not present

## 2019-06-08 DIAGNOSIS — E785 Hyperlipidemia, unspecified: Secondary | ICD-10-CM | POA: Diagnosis not present

## 2019-06-22 DIAGNOSIS — I10 Essential (primary) hypertension: Secondary | ICD-10-CM | POA: Diagnosis not present

## 2019-06-22 DIAGNOSIS — M1711 Unilateral primary osteoarthritis, right knee: Secondary | ICD-10-CM | POA: Diagnosis not present

## 2019-06-22 DIAGNOSIS — Z09 Encounter for follow-up examination after completed treatment for conditions other than malignant neoplasm: Secondary | ICD-10-CM | POA: Diagnosis not present

## 2019-06-23 ENCOUNTER — Other Ambulatory Visit: Payer: Self-pay | Admitting: Internal Medicine

## 2019-06-23 DIAGNOSIS — M5137 Other intervertebral disc degeneration, lumbosacral region: Secondary | ICD-10-CM | POA: Diagnosis not present

## 2019-06-23 DIAGNOSIS — I10 Essential (primary) hypertension: Secondary | ICD-10-CM

## 2019-06-23 DIAGNOSIS — N179 Acute kidney failure, unspecified: Secondary | ICD-10-CM | POA: Diagnosis not present

## 2019-06-26 DIAGNOSIS — R208 Other disturbances of skin sensation: Secondary | ICD-10-CM | POA: Diagnosis not present

## 2019-06-26 DIAGNOSIS — L309 Dermatitis, unspecified: Secondary | ICD-10-CM | POA: Diagnosis not present

## 2019-06-26 DIAGNOSIS — L538 Other specified erythematous conditions: Secondary | ICD-10-CM | POA: Diagnosis not present

## 2019-06-26 DIAGNOSIS — L720 Epidermal cyst: Secondary | ICD-10-CM | POA: Diagnosis not present

## 2019-06-29 ENCOUNTER — Other Ambulatory Visit: Payer: Self-pay | Admitting: Internal Medicine

## 2019-06-29 DIAGNOSIS — N179 Acute kidney failure, unspecified: Secondary | ICD-10-CM

## 2019-06-30 ENCOUNTER — Ambulatory Visit
Admission: RE | Admit: 2019-06-30 | Discharge: 2019-06-30 | Disposition: A | Discharge status: Home / Self Care | Payer: PPO | Source: Ambulatory Visit | Attending: Internal Medicine | Admitting: Internal Medicine

## 2019-06-30 ENCOUNTER — Other Ambulatory Visit: Payer: Self-pay

## 2019-06-30 ENCOUNTER — Ambulatory Visit: Admission: RE | Admit: 2019-06-30 | Payer: PPO | Source: Ambulatory Visit

## 2019-06-30 DIAGNOSIS — N179 Acute kidney failure, unspecified: Secondary | ICD-10-CM | POA: Insufficient documentation

## 2019-07-04 DIAGNOSIS — L03012 Cellulitis of left finger: Secondary | ICD-10-CM | POA: Diagnosis not present

## 2019-07-04 DIAGNOSIS — S61211D Laceration without foreign body of left index finger without damage to nail, subsequent encounter: Secondary | ICD-10-CM | POA: Diagnosis not present

## 2019-07-11 DIAGNOSIS — S61211D Laceration without foreign body of left index finger without damage to nail, subsequent encounter: Secondary | ICD-10-CM | POA: Diagnosis not present

## 2019-07-11 DIAGNOSIS — L03012 Cellulitis of left finger: Secondary | ICD-10-CM | POA: Diagnosis not present

## 2019-07-28 DIAGNOSIS — I10 Essential (primary) hypertension: Secondary | ICD-10-CM | POA: Diagnosis not present

## 2019-08-03 DIAGNOSIS — S61211D Laceration without foreign body of left index finger without damage to nail, subsequent encounter: Secondary | ICD-10-CM | POA: Diagnosis not present

## 2019-08-03 DIAGNOSIS — L03012 Cellulitis of left finger: Secondary | ICD-10-CM | POA: Diagnosis not present

## 2019-08-14 DIAGNOSIS — M542 Cervicalgia: Secondary | ICD-10-CM | POA: Diagnosis not present

## 2019-08-14 DIAGNOSIS — I480 Paroxysmal atrial fibrillation: Secondary | ICD-10-CM | POA: Diagnosis not present

## 2019-08-14 DIAGNOSIS — I10 Essential (primary) hypertension: Secondary | ICD-10-CM | POA: Diagnosis not present

## 2019-08-14 DIAGNOSIS — E782 Mixed hyperlipidemia: Secondary | ICD-10-CM | POA: Diagnosis not present

## 2019-08-30 DIAGNOSIS — I10 Essential (primary) hypertension: Secondary | ICD-10-CM | POA: Diagnosis not present

## 2019-08-30 DIAGNOSIS — E782 Mixed hyperlipidemia: Secondary | ICD-10-CM | POA: Diagnosis not present

## 2019-08-30 DIAGNOSIS — M542 Cervicalgia: Secondary | ICD-10-CM | POA: Diagnosis not present

## 2019-08-30 DIAGNOSIS — I6523 Occlusion and stenosis of bilateral carotid arteries: Secondary | ICD-10-CM | POA: Diagnosis not present

## 2019-09-11 DIAGNOSIS — Z0000 Encounter for general adult medical examination without abnormal findings: Secondary | ICD-10-CM | POA: Diagnosis not present

## 2019-09-11 DIAGNOSIS — I480 Paroxysmal atrial fibrillation: Secondary | ICD-10-CM | POA: Diagnosis not present

## 2019-09-11 DIAGNOSIS — Z8546 Personal history of malignant neoplasm of prostate: Secondary | ICD-10-CM | POA: Diagnosis not present

## 2019-09-11 DIAGNOSIS — I10 Essential (primary) hypertension: Secondary | ICD-10-CM | POA: Diagnosis not present

## 2019-09-11 DIAGNOSIS — E782 Mixed hyperlipidemia: Secondary | ICD-10-CM | POA: Diagnosis not present

## 2019-09-11 DIAGNOSIS — Z79899 Other long term (current) drug therapy: Secondary | ICD-10-CM | POA: Diagnosis not present

## 2020-02-13 DIAGNOSIS — I480 Paroxysmal atrial fibrillation: Secondary | ICD-10-CM | POA: Diagnosis not present

## 2020-02-13 DIAGNOSIS — I10 Essential (primary) hypertension: Secondary | ICD-10-CM | POA: Diagnosis not present

## 2020-02-13 DIAGNOSIS — E782 Mixed hyperlipidemia: Secondary | ICD-10-CM | POA: Diagnosis not present

## 2020-03-14 DIAGNOSIS — R0602 Shortness of breath: Secondary | ICD-10-CM | POA: Diagnosis not present

## 2020-03-14 DIAGNOSIS — Z79899 Other long term (current) drug therapy: Secondary | ICD-10-CM | POA: Diagnosis not present

## 2020-03-14 DIAGNOSIS — E782 Mixed hyperlipidemia: Secondary | ICD-10-CM | POA: Diagnosis not present

## 2020-03-14 DIAGNOSIS — I480 Paroxysmal atrial fibrillation: Secondary | ICD-10-CM | POA: Diagnosis not present

## 2020-03-14 DIAGNOSIS — M5137 Other intervertebral disc degeneration, lumbosacral region: Secondary | ICD-10-CM | POA: Diagnosis not present

## 2020-03-14 DIAGNOSIS — I10 Essential (primary) hypertension: Secondary | ICD-10-CM | POA: Diagnosis not present

## 2020-03-14 DIAGNOSIS — M542 Cervicalgia: Secondary | ICD-10-CM | POA: Diagnosis not present

## 2020-03-14 DIAGNOSIS — Z0000 Encounter for general adult medical examination without abnormal findings: Secondary | ICD-10-CM | POA: Diagnosis not present

## 2020-03-14 DIAGNOSIS — Z8546 Personal history of malignant neoplasm of prostate: Secondary | ICD-10-CM | POA: Diagnosis not present

## 2020-03-14 DIAGNOSIS — Z125 Encounter for screening for malignant neoplasm of prostate: Secondary | ICD-10-CM | POA: Diagnosis not present

## 2020-03-15 DIAGNOSIS — I350 Nonrheumatic aortic (valve) stenosis: Secondary | ICD-10-CM

## 2020-03-15 DIAGNOSIS — R0602 Shortness of breath: Secondary | ICD-10-CM | POA: Diagnosis not present

## 2020-03-15 DIAGNOSIS — I480 Paroxysmal atrial fibrillation: Secondary | ICD-10-CM | POA: Diagnosis not present

## 2020-03-15 HISTORY — DX: Nonrheumatic aortic (valve) stenosis: I35.0

## 2020-04-01 DIAGNOSIS — R9389 Abnormal findings on diagnostic imaging of other specified body structures: Secondary | ICD-10-CM | POA: Diagnosis not present

## 2020-04-01 DIAGNOSIS — J849 Interstitial pulmonary disease, unspecified: Secondary | ICD-10-CM | POA: Diagnosis not present

## 2020-04-01 DIAGNOSIS — R918 Other nonspecific abnormal finding of lung field: Secondary | ICD-10-CM | POA: Diagnosis not present

## 2020-04-03 DIAGNOSIS — Z23 Encounter for immunization: Secondary | ICD-10-CM | POA: Diagnosis not present

## 2020-04-03 DIAGNOSIS — J849 Interstitial pulmonary disease, unspecified: Secondary | ICD-10-CM | POA: Diagnosis not present

## 2020-04-04 DIAGNOSIS — M7522 Bicipital tendinitis, left shoulder: Secondary | ICD-10-CM | POA: Diagnosis not present

## 2020-04-04 DIAGNOSIS — Z85828 Personal history of other malignant neoplasm of skin: Secondary | ICD-10-CM | POA: Diagnosis not present

## 2020-04-04 DIAGNOSIS — L565 Disseminated superficial actinic porokeratosis (DSAP): Secondary | ICD-10-CM | POA: Diagnosis not present

## 2020-04-04 DIAGNOSIS — L570 Actinic keratosis: Secondary | ICD-10-CM | POA: Diagnosis not present

## 2020-04-04 DIAGNOSIS — D2262 Melanocytic nevi of left upper limb, including shoulder: Secondary | ICD-10-CM | POA: Diagnosis not present

## 2020-04-04 DIAGNOSIS — D2261 Melanocytic nevi of right upper limb, including shoulder: Secondary | ICD-10-CM | POA: Diagnosis not present

## 2020-04-04 DIAGNOSIS — L538 Other specified erythematous conditions: Secondary | ICD-10-CM | POA: Diagnosis not present

## 2020-04-04 DIAGNOSIS — D225 Melanocytic nevi of trunk: Secondary | ICD-10-CM | POA: Diagnosis not present

## 2020-04-04 DIAGNOSIS — D2271 Melanocytic nevi of right lower limb, including hip: Secondary | ICD-10-CM | POA: Diagnosis not present

## 2020-04-04 DIAGNOSIS — L820 Inflamed seborrheic keratosis: Secondary | ICD-10-CM | POA: Diagnosis not present

## 2020-04-04 DIAGNOSIS — X32XXXA Exposure to sunlight, initial encounter: Secondary | ICD-10-CM | POA: Diagnosis not present

## 2020-04-11 ENCOUNTER — Other Ambulatory Visit: Payer: Self-pay | Admitting: Pulmonary Disease

## 2020-04-11 DIAGNOSIS — H5203 Hypermetropia, bilateral: Secondary | ICD-10-CM | POA: Diagnosis not present

## 2020-04-11 DIAGNOSIS — H02834 Dermatochalasis of left upper eyelid: Secondary | ICD-10-CM | POA: Diagnosis not present

## 2020-04-11 DIAGNOSIS — H2513 Age-related nuclear cataract, bilateral: Secondary | ICD-10-CM | POA: Diagnosis not present

## 2020-04-11 DIAGNOSIS — H02831 Dermatochalasis of right upper eyelid: Secondary | ICD-10-CM | POA: Diagnosis not present

## 2020-04-11 DIAGNOSIS — H524 Presbyopia: Secondary | ICD-10-CM | POA: Diagnosis not present

## 2020-04-11 DIAGNOSIS — J849 Interstitial pulmonary disease, unspecified: Secondary | ICD-10-CM

## 2020-04-16 ENCOUNTER — Ambulatory Visit
Admission: RE | Admit: 2020-04-16 | Discharge: 2020-04-16 | Disposition: A | Discharge status: Home / Self Care | Payer: PPO | Source: Ambulatory Visit | Attending: Pulmonary Disease | Admitting: Pulmonary Disease

## 2020-04-16 ENCOUNTER — Other Ambulatory Visit: Payer: Self-pay

## 2020-04-16 DIAGNOSIS — J849 Interstitial pulmonary disease, unspecified: Secondary | ICD-10-CM | POA: Insufficient documentation

## 2020-04-16 DIAGNOSIS — J8410 Pulmonary fibrosis, unspecified: Secondary | ICD-10-CM | POA: Diagnosis not present

## 2020-04-16 DIAGNOSIS — J984 Other disorders of lung: Secondary | ICD-10-CM | POA: Diagnosis not present

## 2020-04-16 DIAGNOSIS — R918 Other nonspecific abnormal finding of lung field: Secondary | ICD-10-CM | POA: Diagnosis not present

## 2020-04-16 DIAGNOSIS — J479 Bronchiectasis, uncomplicated: Secondary | ICD-10-CM | POA: Diagnosis not present

## 2020-04-16 DIAGNOSIS — I77810 Thoracic aortic ectasia: Secondary | ICD-10-CM

## 2020-04-16 HISTORY — DX: Thoracic aortic ectasia: I77.810

## 2020-04-16 LAB — POCT I-STAT CREATININE: Creatinine, Ser: 1.4 mg/dL — ABNORMAL HIGH (ref 0.61–1.24)

## 2020-04-16 MED ORDER — IOHEXOL 300 MG/ML  SOLN
75.0000 mL | Freq: Once | INTRAMUSCULAR | Status: AC | PRN
  Administered 2020-04-16: 75 mL via INTRAVENOUS

## 2020-05-01 DIAGNOSIS — J849 Interstitial pulmonary disease, unspecified: Secondary | ICD-10-CM | POA: Diagnosis not present

## 2020-05-01 DIAGNOSIS — Z01818 Encounter for other preprocedural examination: Secondary | ICD-10-CM | POA: Diagnosis not present

## 2020-07-31 DIAGNOSIS — J849 Interstitial pulmonary disease, unspecified: Secondary | ICD-10-CM | POA: Diagnosis not present

## 2020-07-31 DIAGNOSIS — E782 Mixed hyperlipidemia: Secondary | ICD-10-CM | POA: Diagnosis not present

## 2020-07-31 DIAGNOSIS — I480 Paroxysmal atrial fibrillation: Secondary | ICD-10-CM | POA: Diagnosis not present

## 2020-07-31 DIAGNOSIS — I10 Essential (primary) hypertension: Secondary | ICD-10-CM | POA: Diagnosis not present

## 2020-09-16 DIAGNOSIS — J849 Interstitial pulmonary disease, unspecified: Secondary | ICD-10-CM | POA: Diagnosis not present

## 2020-09-16 DIAGNOSIS — Z0000 Encounter for general adult medical examination without abnormal findings: Secondary | ICD-10-CM | POA: Diagnosis not present

## 2020-09-16 DIAGNOSIS — Z125 Encounter for screening for malignant neoplasm of prostate: Secondary | ICD-10-CM | POA: Diagnosis not present

## 2020-09-16 DIAGNOSIS — I480 Paroxysmal atrial fibrillation: Secondary | ICD-10-CM | POA: Diagnosis not present

## 2020-09-16 DIAGNOSIS — M5136 Other intervertebral disc degeneration, lumbar region: Secondary | ICD-10-CM | POA: Diagnosis not present

## 2020-09-16 DIAGNOSIS — R194 Change in bowel habit: Secondary | ICD-10-CM | POA: Diagnosis not present

## 2020-09-16 DIAGNOSIS — I10 Essential (primary) hypertension: Secondary | ICD-10-CM | POA: Diagnosis not present

## 2020-09-16 DIAGNOSIS — E7800 Pure hypercholesterolemia, unspecified: Secondary | ICD-10-CM | POA: Diagnosis not present

## 2020-09-16 DIAGNOSIS — Z79899 Other long term (current) drug therapy: Secondary | ICD-10-CM | POA: Diagnosis not present

## 2020-09-25 DIAGNOSIS — M545 Low back pain, unspecified: Secondary | ICD-10-CM | POA: Diagnosis not present

## 2020-09-25 DIAGNOSIS — M62830 Muscle spasm of back: Secondary | ICD-10-CM | POA: Diagnosis not present

## 2020-09-26 DIAGNOSIS — M545 Low back pain, unspecified: Secondary | ICD-10-CM | POA: Diagnosis not present

## 2020-09-30 DIAGNOSIS — I480 Paroxysmal atrial fibrillation: Secondary | ICD-10-CM | POA: Diagnosis not present

## 2020-09-30 DIAGNOSIS — Z86010 Personal history of colonic polyps: Secondary | ICD-10-CM | POA: Diagnosis not present

## 2020-09-30 DIAGNOSIS — J849 Interstitial pulmonary disease, unspecified: Secondary | ICD-10-CM | POA: Diagnosis not present

## 2020-09-30 DIAGNOSIS — K5730 Diverticulosis of large intestine without perforation or abscess without bleeding: Secondary | ICD-10-CM | POA: Diagnosis not present

## 2020-09-30 DIAGNOSIS — K219 Gastro-esophageal reflux disease without esophagitis: Secondary | ICD-10-CM | POA: Diagnosis not present

## 2020-11-04 DIAGNOSIS — J849 Interstitial pulmonary disease, unspecified: Secondary | ICD-10-CM | POA: Diagnosis not present

## 2020-11-18 DIAGNOSIS — J849 Interstitial pulmonary disease, unspecified: Secondary | ICD-10-CM | POA: Diagnosis not present

## 2020-11-18 DIAGNOSIS — M3320 Polymyositis, organ involvement unspecified: Secondary | ICD-10-CM | POA: Diagnosis not present

## 2020-11-18 DIAGNOSIS — M359 Systemic involvement of connective tissue, unspecified: Secondary | ICD-10-CM | POA: Diagnosis not present

## 2020-12-19 ENCOUNTER — Ambulatory Visit: Payer: Self-pay

## 2021-01-13 DIAGNOSIS — J849 Interstitial pulmonary disease, unspecified: Secondary | ICD-10-CM | POA: Diagnosis not present

## 2021-01-13 DIAGNOSIS — J479 Bronchiectasis, uncomplicated: Secondary | ICD-10-CM | POA: Diagnosis not present

## 2021-01-13 DIAGNOSIS — R059 Cough, unspecified: Secondary | ICD-10-CM | POA: Diagnosis not present

## 2021-01-13 DIAGNOSIS — J40 Bronchitis, not specified as acute or chronic: Secondary | ICD-10-CM | POA: Diagnosis not present

## 2021-01-21 ENCOUNTER — Encounter: Payer: Self-pay | Admitting: Internal Medicine

## 2021-01-21 DIAGNOSIS — M359 Systemic involvement of connective tissue, unspecified: Secondary | ICD-10-CM | POA: Diagnosis not present

## 2021-01-21 DIAGNOSIS — M3320 Polymyositis, organ involvement unspecified: Secondary | ICD-10-CM | POA: Diagnosis not present

## 2021-01-22 ENCOUNTER — Ambulatory Visit: Payer: PPO | Admitting: Anesthesiology

## 2021-01-22 ENCOUNTER — Ambulatory Visit
Admission: RE | Admit: 2021-01-22 | Discharge: 2021-01-22 | Disposition: A | Discharge status: Home / Self Care | Payer: PPO | Attending: Internal Medicine | Admitting: Internal Medicine

## 2021-01-22 ENCOUNTER — Encounter
Admission: RE | Disposition: A | Discharge status: Home / Self Care | Payer: Self-pay | Source: Home / Self Care | Attending: Internal Medicine

## 2021-01-22 ENCOUNTER — Encounter: Payer: Self-pay | Admitting: Internal Medicine

## 2021-01-22 DIAGNOSIS — K5730 Diverticulosis of large intestine without perforation or abscess without bleeding: Secondary | ICD-10-CM | POA: Insufficient documentation

## 2021-01-22 DIAGNOSIS — I4891 Unspecified atrial fibrillation: Secondary | ICD-10-CM | POA: Insufficient documentation

## 2021-01-22 DIAGNOSIS — Z8546 Personal history of malignant neoplasm of prostate: Secondary | ICD-10-CM | POA: Diagnosis not present

## 2021-01-22 DIAGNOSIS — K640 First degree hemorrhoids: Secondary | ICD-10-CM | POA: Diagnosis not present

## 2021-01-22 DIAGNOSIS — Z7982 Long term (current) use of aspirin: Secondary | ICD-10-CM | POA: Insufficient documentation

## 2021-01-22 DIAGNOSIS — D122 Benign neoplasm of ascending colon: Secondary | ICD-10-CM | POA: Diagnosis not present

## 2021-01-22 DIAGNOSIS — Z7901 Long term (current) use of anticoagulants: Secondary | ICD-10-CM | POA: Diagnosis not present

## 2021-01-22 DIAGNOSIS — D120 Benign neoplasm of cecum: Secondary | ICD-10-CM | POA: Diagnosis not present

## 2021-01-22 DIAGNOSIS — Z791 Long term (current) use of non-steroidal anti-inflammatories (NSAID): Secondary | ICD-10-CM | POA: Insufficient documentation

## 2021-01-22 DIAGNOSIS — Z87891 Personal history of nicotine dependence: Secondary | ICD-10-CM | POA: Insufficient documentation

## 2021-01-22 DIAGNOSIS — Z91040 Latex allergy status: Secondary | ICD-10-CM | POA: Diagnosis not present

## 2021-01-22 DIAGNOSIS — Z79899 Other long term (current) drug therapy: Secondary | ICD-10-CM | POA: Diagnosis not present

## 2021-01-22 DIAGNOSIS — K635 Polyp of colon: Secondary | ICD-10-CM | POA: Diagnosis not present

## 2021-01-22 DIAGNOSIS — Z1211 Encounter for screening for malignant neoplasm of colon: Secondary | ICD-10-CM | POA: Insufficient documentation

## 2021-01-22 DIAGNOSIS — K649 Unspecified hemorrhoids: Secondary | ICD-10-CM | POA: Diagnosis not present

## 2021-01-22 DIAGNOSIS — Z86010 Personal history of colonic polyps: Secondary | ICD-10-CM | POA: Diagnosis not present

## 2021-01-22 HISTORY — PX: COLONOSCOPY WITH PROPOFOL: SHX5780

## 2021-01-22 SURGERY — COLONOSCOPY WITH PROPOFOL
Anesthesia: General

## 2021-01-22 MED ORDER — SODIUM CHLORIDE (PF) 0.9 % IJ SOLN
INTRAMUSCULAR | Status: DC | PRN
  Administered 2021-01-22: 3 mL

## 2021-01-22 MED ORDER — PROPOFOL 500 MG/50ML IV EMUL
INTRAVENOUS | Status: AC
  Filled 2021-01-22: qty 50

## 2021-01-22 MED ORDER — SODIUM CHLORIDE 0.9 % IV SOLN
INTRAVENOUS | Status: DC
  Administered 2021-01-22: 20 mL/h via INTRAVENOUS

## 2021-01-22 MED ORDER — LIDOCAINE HCL (CARDIAC) PF 100 MG/5ML IV SOSY
PREFILLED_SYRINGE | INTRAVENOUS | Status: DC | PRN
  Administered 2021-01-22: 50 mg via INTRAVENOUS

## 2021-01-22 MED ORDER — PROPOFOL 500 MG/50ML IV EMUL
INTRAVENOUS | Status: DC | PRN
  Administered 2021-01-22: 150 ug/kg/min via INTRAVENOUS

## 2021-01-22 MED ORDER — PROPOFOL 10 MG/ML IV BOLUS
INTRAVENOUS | Status: DC | PRN
  Administered 2021-01-22: 80 mg via INTRAVENOUS

## 2021-01-22 NOTE — Interval H&P Note (Signed)
History and Physical Interval Note:  01/22/2021 10:58 AM  John Marks  has presented today for surgery, with the diagnosis of HX ADEN POLYPS.  The various methods of treatment have been discussed with the patient and family. After consideration of risks, benefits and other options for treatment, the patient has consented to  Procedure(s): COLONOSCOPY WITH PROPOFOL (N/A) as a surgical intervention.  The patient's history has been reviewed, patient examined, no change in status, stable for surgery.  I have reviewed the patient's chart and labs.  Questions were answered to the patient's satisfaction.     [place], [place]

## 2021-01-22 NOTE — H&P (Signed)
Outpatient short stay form Pre-procedure 01/22/2021 10:57 AM John Marks John Marks, M.D.  Primary Physician: John Marks, M.D.  Reason for visit:  Personal history of non-advanced adenoma of the colon (2017).  History of present illness:                            Patient presents for colonoscopy for a personal hx of colon polyps. The patient denies abdominal pain, abnormal weight loss or rectal bleeding.      Current Facility-Administered Medications:    0.9 %  sodium chloride infusion, , Intravenous, Continuous, [place], Benay Pike, MD, Last Rate: 20 mL/hr at 01/22/21 0959, 20 mL/hr at 01/22/21 0959  Medications Prior to Admission  Medication Sig Dispense Refill Last Dose   acetaminophen-codeine (TYLENOL #3) 300-30 MG tablet Take 1 tablet by mouth every 6 (six) hours as needed.   01/21/2021   albuterol (VENTOLIN HFA) 108 (90 Base) MCG/ACT inhaler Inhale 2 puffs into the lungs every 6 (six) hours as needed for wheezing or shortness of breath.   01/21/2021   aspirin 81 MG tablet Take 81 mg by mouth daily.   Past Week   atorvastatin (LIPITOR) 40 MG tablet Take 40 mg by mouth daily.   01/21/2021   Choline Fenofibrate 135 MG capsule Take 135 mg by mouth daily.   01/21/2021   flecainide (TAMBOCOR) 50 MG tablet Take 50 mg by mouth 2 (two) times daily.   01/22/2021 at 0600   Fluticasone-Umeclidin-Vilant (TRELEGY ELLIPTA) 100-62.5-25 MCG/INH AEPB Inhale into the lungs.   Past Week   glucosamine-chondroitin 500-400 MG tablet Take 1 tablet by mouth 3 (three) times daily.   01/21/2021   meloxicam (MOBIC) 15 MG tablet Take 15 mg by mouth daily.   01/21/2021   omeprazole (PRILOSEC) 20 MG capsule Take 20 mg by mouth daily.   01/21/2021   apixaban (ELIQUIS) 5 MG TABS tablet Take 1 tablet (5 mg total) by mouth 2 (two) times daily. (Patient not taking: Reported on 01/22/2021) 60 tablet 2 Completed Course   metoprolol tartrate (LOPRESSOR) 25 MG tablet Take 1 tablet (25 mg total) by mouth 2 (two) times daily. 60 tablet 2       Allergies  Allergen Reactions   Latex Itching     Past Medical History:  Diagnosis Date   Acid reflux    Arthritis    Atrial fibrillation (HCC)    DDD (degenerative disc disease), cervical    Hyperlipidemia    Hypertension    Prostate cancer ([place])     Review of systems:  Otherwise negative.    Physical Exam  Gen: Alert, oriented. Appears stated age.  HEENT: [place]/AT. PERRLA. Lungs: CTA, no wheezes. CV: RR nl S1, S2. Abd: soft, benign, no masses. BS+ Ext: No edema. Pulses 2+    Planned procedures: Proceed with colonoscopy. The patient understands the nature of the planned procedure, indications, risks, alternatives and potential complications including but not limited to bleeding, infection, perforation, damage to internal organs and possible oversedation/side effects from anesthesia. The patient agrees and gives consent to proceed.  Please refer to procedure notes for findings, recommendations and patient disposition/instructions.     John Marks John Marks, M.D. Gastroenterology 01/22/2021  10:57 AM

## 2021-01-22 NOTE — Transfer of Care (Signed)
Immediate Anesthesia Transfer of Care Note  Patient: John Marks  Procedure(s) Performed: COLONOSCOPY WITH PROPOFOL  Patient Location: PACU  Anesthesia Type:General  Level of Consciousness: drowsy  Airway & Oxygen Therapy: Patient Spontanous Breathing  Post-op Assessment: Report given to RN and Post -op Vital signs reviewed and stable  Post vital signs: stable  Last Vitals:  Vitals Value Taken Time  BP    Temp    Pulse 53 01/22/21 1142  Resp 11 01/22/21 1142  SpO2 94 % 01/22/21 1142  Vitals shown include unvalidated device data.  Last Pain:  Vitals:   01/22/21 0949  TempSrc: Temporal  PainSc: 0-No pain         Complications: No notable events documented.

## 2021-01-22 NOTE — Anesthesia Preprocedure Evaluation (Signed)
Anesthesia Evaluation  Patient identified by MRN, date of birth, ID band Patient awake    Reviewed: Allergy & Precautions, H&P , NPO status , Patient's Chart, lab work & pertinent test results, reviewed documented beta blocker date and time   History of Anesthesia Complications Negative for: history of anesthetic complications  Airway Mallampati: II  TM Distance: >3 FB Neck ROM: full    Dental  (+) Teeth Intact, Dental Advidsory Given, Caps   Pulmonary neg pulmonary ROS, former smoker,    Pulmonary exam normal breath sounds clear to auscultation       Cardiovascular Exercise Tolerance: Good hypertension, (-) angina(-) Past MI and (-) Cardiac Stents + dysrhythmias Atrial Fibrillation (-) Valvular Problems/Murmurs Rhythm:regular Rate:Normal     Neuro/Psych negative neurological ROS  negative psych ROS   GI/Hepatic Neg liver ROS, GERD  ,  Endo/Other  negative endocrine ROS  Renal/GU negative Renal ROS  negative genitourinary   Musculoskeletal   Abdominal   Peds  Hematology negative hematology ROS (+)   Anesthesia Other Findings Past Medical History: No date: Acid reflux No date: Arthritis No date: Atrial fibrillation (HCC) No date: DDD (degenerative disc disease), cervical No date: Hyperlipidemia No date: Hypertension No date: Prostate cancer (HCC)   Reproductive/Obstetrics negative OB ROS                             Anesthesia Physical  Anesthesia Plan  ASA: 2  Anesthesia Plan: General   Post-op Pain Management:    Induction: Intravenous  PONV Risk Score and Plan: 2 and TIVA and Propofol infusion  Airway Management Planned: Natural Airway and Nasal Cannula  Additional Equipment:   Intra-op Plan:   Post-operative Plan:   Informed Consent: I have reviewed the patients History and Physical, chart, labs and discussed the procedure including the risks, benefits and  alternatives for the proposed anesthesia with the patient or authorized representative who has indicated his/her understanding and acceptance.     Dental Advisory Given  Plan Discussed with: CRNA  Anesthesia Plan Comments:         Anesthesia Quick Evaluation

## 2021-01-22 NOTE — Op Note (Signed)
[facility] Patient Name: John Marks Procedure Date: 01/22/2021 11:05 AM MRN: [id number] Account #: [id number] Date of Birth: Dec 03, 1940 Admit Type: Outpatient Age: 80 Room: [facility] ENDO ROOM 2 Gender: Male Note Status: Finalized Procedure:             Colonoscopy Indications:           Surveillance: Personal history of adenomatous polyps                         on last colonoscopy > 5 years ago Providers:             Lorie Apley K. Alice Reichert MD, MD Referring MD:          Leonie Douglas. Doy Hutching, MD (Referring MD) Medicines:             Propofol per Anesthesia Complications:         No immediate complications. Procedure:             Pre-Anesthesia Assessment:                        - The risks and benefits of the procedure and the                         sedation options and risks were discussed with the                         patient. All questions were answered and informed                         consent was obtained.                        - Patient identification and proposed procedure were                         verified prior to the procedure by the nurse. The                         procedure was verified in the procedure room.                        - ASA Grade Assessment: III - A patient with severe                         systemic disease.                        - After reviewing the risks and benefits, the patient                         was deemed in satisfactory condition to undergo the                         procedure.                        After obtaining informed consent, the colonoscope was                         passed under direct  vision. Throughout the procedure,                         the patient's blood pressure, pulse, and oxygen                         saturations were monitored continuously. The                         Colonoscope was introduced through the anus and                         advanced to the the cecum, identified by  appendiceal                         orifice and ileocecal valve. The entire colon was                         examined. Findings:      The perianal and digital rectal examinations were normal. Pertinent       negatives include normal sphincter tone and no palpable rectal lesions.      Multiple small and large-mouthed diverticula were found in the sigmoid       colon.      Three sessile polyps were found in the ascending colon and cecum. The       polyps were 3 to 4 mm in size. These polyps were removed with a jumbo       cold forceps. Resection and retrieval were complete.      A 20 mm polyp was found in the ascending colon. The polyp was sessile.       Polypectomy was attempted, initially using a saline injection-lift       technique with a hot snare. Polyp resection was incomplete with this       device. This intervention then required a different device and       polypectomy technique. The polyp was removed with a saline       injection-lift technique using a hot snare. Resection and retrieval were       complete. To prevent bleeding after the polypectomy, two hemostatic       clips were successfully placed (MR conditional). There was no bleeding       during, or at the end, of the procedure.      A 10 mm polyp was found in the proximal ascending colon. The polyp was       sessile. The polyp was removed with a hot snare. Resection and retrieval       were complete. To prevent bleeding after the polypectomy, one hemostatic       clip was successfully placed (MR conditional). There was no bleeding at       the end of the procedure.      A 11 mm polyp was found in the ascending colon. The polyp was sessile.       The polyp was removed with a hot snare. Resection and retrieval were       complete.      Non-bleeding internal hemorrhoids were found during retroflexion. The       hemorrhoids were Grade I (internal hemorrhoids that do not prolapse).      The exam was otherwise without  abnormality. Impression:            -  Diverticulosis in the sigmoid colon.                        - Three 3 to 4 mm polyps in the ascending colon and in                         the cecum, removed with a jumbo cold forceps. Resected                         and retrieved.                        - One 20 mm polyp in the ascending colon, removed                         using injection-lift and a hot snare. Resected and                         retrieved. Clips (MR conditional) were placed.                        - One 10 mm polyp in the proximal ascending colon,                         removed with a hot snare. Resected and retrieved. Clip                         (MR conditional) was placed.                        - One 11 mm polyp in the ascending colon, removed with                         a hot snare. Resected and retrieved.                        - Non-bleeding internal hemorrhoids.                        - The examination was otherwise normal. Recommendation:        - Patient has a contact number available for                         emergencies. The signs and symptoms of potential                         delayed complications were discussed with the patient.                         Return to normal activities tomorrow. Written                         discharge instructions were provided to the patient.                        - Resume previous diet.                        - Continue  present medications.                        - If polyps are benign or adenomatous without                         dysplasia, I will advise NO further colonoscopy due to                         advanced age and/or severe comorbidity.                        - No aspirin, ibuprofen, naproxen, or other                         non-steroidal anti-inflammatory drugs for 5 days after                         polyp removal.                        - Return to GI office PRN.                        - The findings and  recommendations were discussed with                         the patient. Procedure Code(s):     --- Professional ---                        [phone], Colonoscopy, flexible; with removal of                         tumor(s), polyp(s), or other lesion(s) by snare                         technique                        45380, 75, Colonoscopy, flexible; with biopsy, single                         or multiple                        45381, Colonoscopy, flexible; with directed submucosal                         injection(s), any substance Diagnosis Code(s):     --- Professional ---                        K57.30, Diverticulosis of large intestine without                         perforation or abscess without bleeding                        K64.0, First degree hemorrhoids                        Z86.010, Personal history of colonic polyps  K63.5, Polyp of colon CPT copyright 2019 American Medical Association. All rights reserved. The codes documented in this report are preliminary and upon coder review may  be revised to meet current compliance requirements. Efrain Sella MD, MD 01/22/2021 11:45:04 AM This report has been signed electronically. Number of Addenda: 0 Note Initiated On: 01/22/2021 11:05 AM Scope Withdrawal Time: 0 hours 17 minutes 58 seconds  Total Procedure Duration: 0 hours 25 minutes 30 seconds  Estimated Blood Loss:  Estimated blood loss was minimal.      [facility]

## 2021-01-22 NOTE — Anesthesia Postprocedure Evaluation (Signed)
Anesthesia Post Note  Patient: John Marks  Procedure(s) Performed: COLONOSCOPY WITH PROPOFOL  Patient location during evaluation: Endoscopy Anesthesia Type: General Level of consciousness: awake and alert Pain management: pain level controlled Vital Signs Assessment: post-procedure vital signs reviewed and stable Respiratory status: spontaneous breathing, nonlabored ventilation, respiratory function stable and patient connected to nasal cannula oxygen Cardiovascular status: blood pressure returned to baseline and stable Postop Assessment: no apparent nausea or vomiting Anesthetic complications: no   No notable events documented.   Last Vitals:  Vitals:   01/22/21 1150 01/22/21 1200  BP: 108/75 122/66  Pulse:    Resp: (!) 22   Temp:    SpO2:      Last Pain:  Vitals:   01/22/21 1200  TempSrc:   PainSc: 0-No pain                 Martha Clan

## 2021-01-23 ENCOUNTER — Encounter: Payer: Self-pay | Admitting: Internal Medicine

## 2021-01-23 LAB — SURGICAL PATHOLOGY

## 2021-02-04 DIAGNOSIS — I10 Essential (primary) hypertension: Secondary | ICD-10-CM | POA: Diagnosis not present

## 2021-02-04 DIAGNOSIS — I480 Paroxysmal atrial fibrillation: Secondary | ICD-10-CM | POA: Diagnosis not present

## 2021-02-04 DIAGNOSIS — J849 Interstitial pulmonary disease, unspecified: Secondary | ICD-10-CM | POA: Diagnosis not present

## 2021-02-04 DIAGNOSIS — E7800 Pure hypercholesterolemia, unspecified: Secondary | ICD-10-CM | POA: Diagnosis not present

## 2021-03-06 DIAGNOSIS — Z03818 Encounter for observation for suspected exposure to other biological agents ruled out: Secondary | ICD-10-CM | POA: Diagnosis not present

## 2021-03-06 DIAGNOSIS — Z20822 Contact with and (suspected) exposure to covid-19: Secondary | ICD-10-CM | POA: Diagnosis not present

## 2021-03-06 DIAGNOSIS — J849 Interstitial pulmonary disease, unspecified: Secondary | ICD-10-CM | POA: Diagnosis not present

## 2021-03-25 DIAGNOSIS — Z0000 Encounter for general adult medical examination without abnormal findings: Secondary | ICD-10-CM | POA: Diagnosis not present

## 2021-03-25 DIAGNOSIS — Z79899 Other long term (current) drug therapy: Secondary | ICD-10-CM | POA: Diagnosis not present

## 2021-03-25 DIAGNOSIS — G629 Polyneuropathy, unspecified: Secondary | ICD-10-CM | POA: Diagnosis not present

## 2021-03-25 DIAGNOSIS — J849 Interstitial pulmonary disease, unspecified: Secondary | ICD-10-CM | POA: Diagnosis not present

## 2021-03-25 DIAGNOSIS — N1831 Chronic kidney disease, stage 3a: Secondary | ICD-10-CM | POA: Diagnosis not present

## 2021-03-25 DIAGNOSIS — I10 Essential (primary) hypertension: Secondary | ICD-10-CM | POA: Diagnosis not present

## 2021-03-25 DIAGNOSIS — I480 Paroxysmal atrial fibrillation: Secondary | ICD-10-CM | POA: Diagnosis not present

## 2021-03-25 DIAGNOSIS — Z125 Encounter for screening for malignant neoplasm of prostate: Secondary | ICD-10-CM | POA: Diagnosis not present

## 2021-03-25 DIAGNOSIS — Z8546 Personal history of malignant neoplasm of prostate: Secondary | ICD-10-CM | POA: Diagnosis not present

## 2021-03-25 DIAGNOSIS — E7800 Pure hypercholesterolemia, unspecified: Secondary | ICD-10-CM | POA: Diagnosis not present

## 2021-03-26 DIAGNOSIS — G629 Polyneuropathy, unspecified: Secondary | ICD-10-CM | POA: Diagnosis not present

## 2021-03-26 DIAGNOSIS — Z79899 Other long term (current) drug therapy: Secondary | ICD-10-CM | POA: Diagnosis not present

## 2021-03-26 DIAGNOSIS — Z125 Encounter for screening for malignant neoplasm of prostate: Secondary | ICD-10-CM | POA: Diagnosis not present

## 2021-03-26 DIAGNOSIS — I10 Essential (primary) hypertension: Secondary | ICD-10-CM | POA: Diagnosis not present

## 2021-03-26 DIAGNOSIS — E7800 Pure hypercholesterolemia, unspecified: Secondary | ICD-10-CM | POA: Diagnosis not present

## 2021-04-07 DIAGNOSIS — D2271 Melanocytic nevi of right lower limb, including hip: Secondary | ICD-10-CM | POA: Diagnosis not present

## 2021-04-07 DIAGNOSIS — D225 Melanocytic nevi of trunk: Secondary | ICD-10-CM | POA: Diagnosis not present

## 2021-04-07 DIAGNOSIS — L570 Actinic keratosis: Secondary | ICD-10-CM | POA: Diagnosis not present

## 2021-04-07 DIAGNOSIS — L821 Other seborrheic keratosis: Secondary | ICD-10-CM | POA: Diagnosis not present

## 2021-04-07 DIAGNOSIS — Z85828 Personal history of other malignant neoplasm of skin: Secondary | ICD-10-CM | POA: Diagnosis not present

## 2021-04-07 DIAGNOSIS — D2261 Melanocytic nevi of right upper limb, including shoulder: Secondary | ICD-10-CM | POA: Diagnosis not present

## 2021-04-25 DIAGNOSIS — M25461 Effusion, right knee: Secondary | ICD-10-CM | POA: Diagnosis not present

## 2021-04-25 DIAGNOSIS — M25561 Pain in right knee: Secondary | ICD-10-CM | POA: Diagnosis not present

## 2021-04-25 DIAGNOSIS — G8929 Other chronic pain: Secondary | ICD-10-CM | POA: Diagnosis not present

## 2021-04-25 DIAGNOSIS — M1711 Unilateral primary osteoarthritis, right knee: Secondary | ICD-10-CM | POA: Diagnosis not present

## 2021-05-05 IMAGING — US US RENAL
1 series · 14 of 25 positions shown · non-contrast
Comparison: None.

CLINICAL DATA: Acute renal insufficiency

EXAM:
RENAL / URINARY TRACT ULTRASOUND COMPLETE

[Series 1: us renal · 47 acquisitions, 14 frames shown]
[im 1/47]
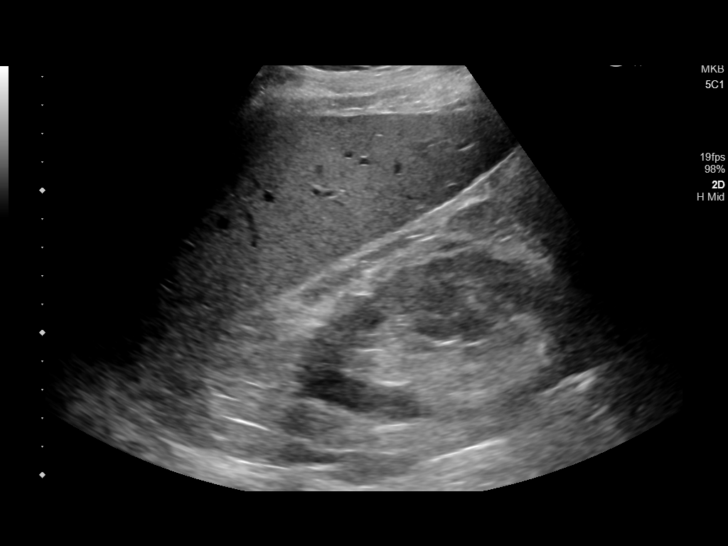
[im 4/47]
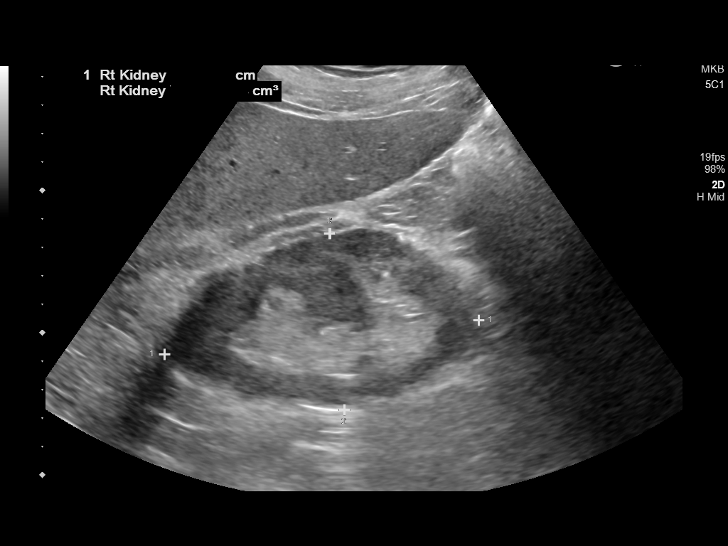
[im 8/47]
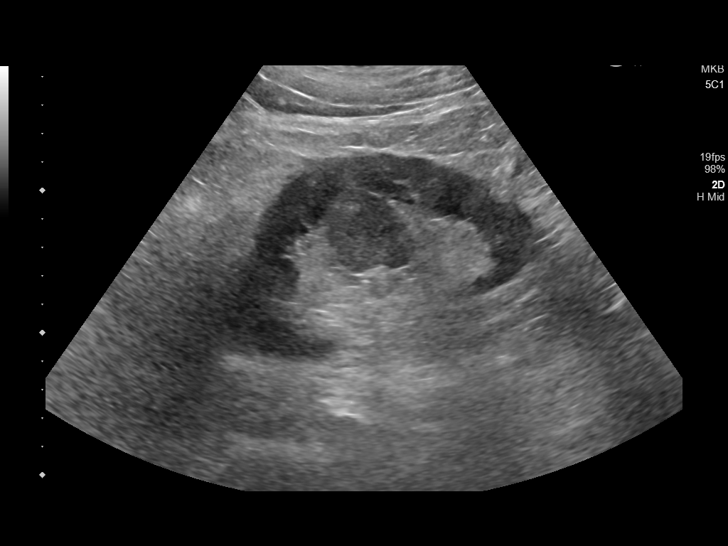
[im 12/47]
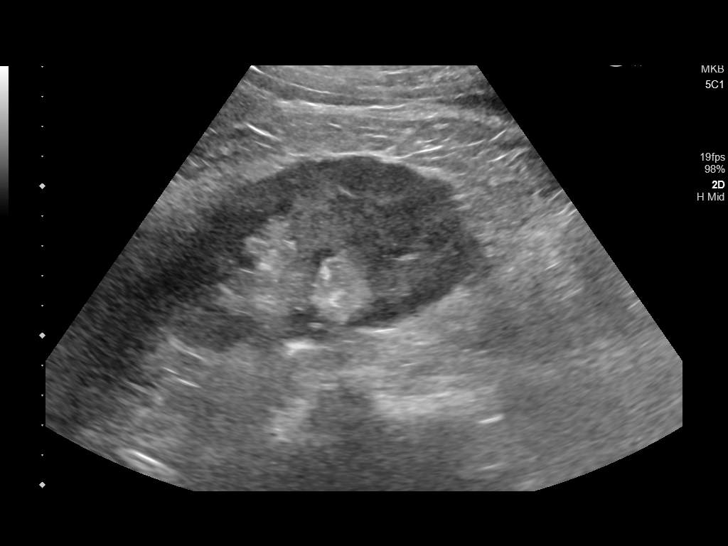
[im 16/47]
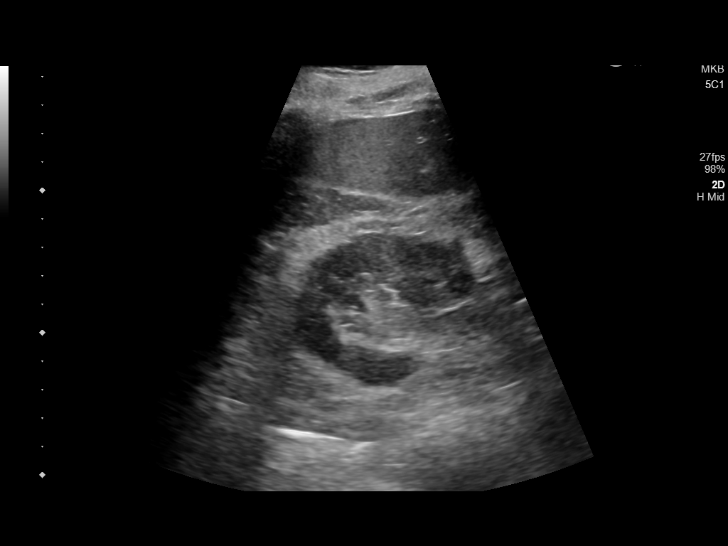
[im 18/47]
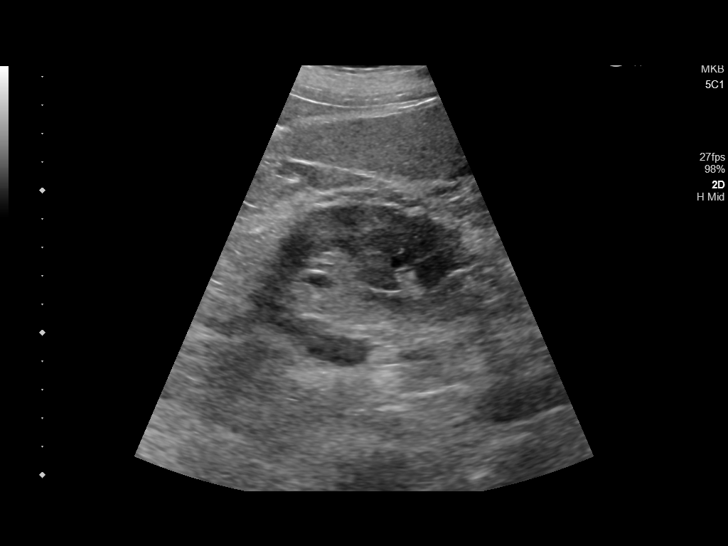
[im 22/47]
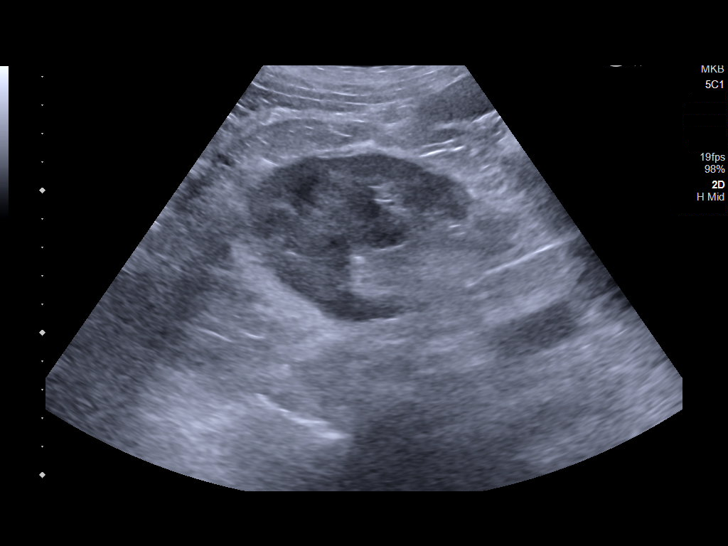
[im 25/47]
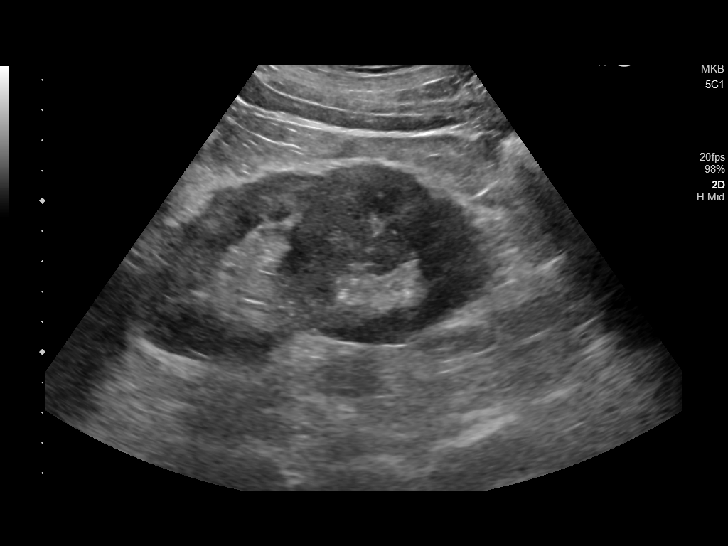
[im 29/47]
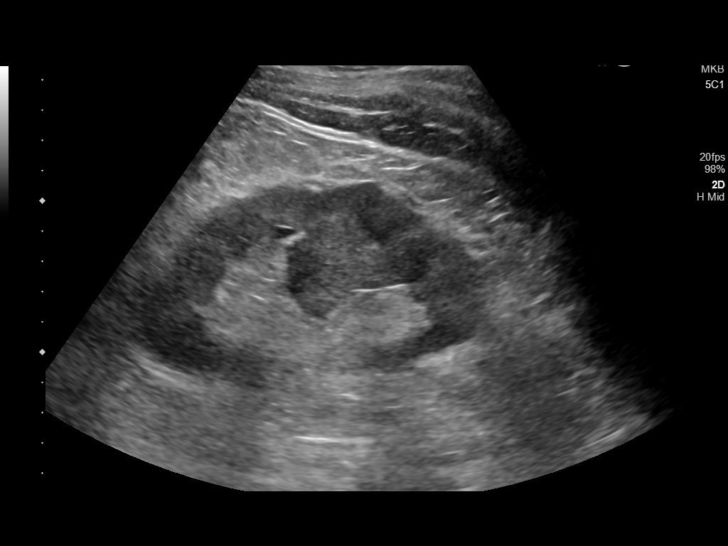
[im 31/47]
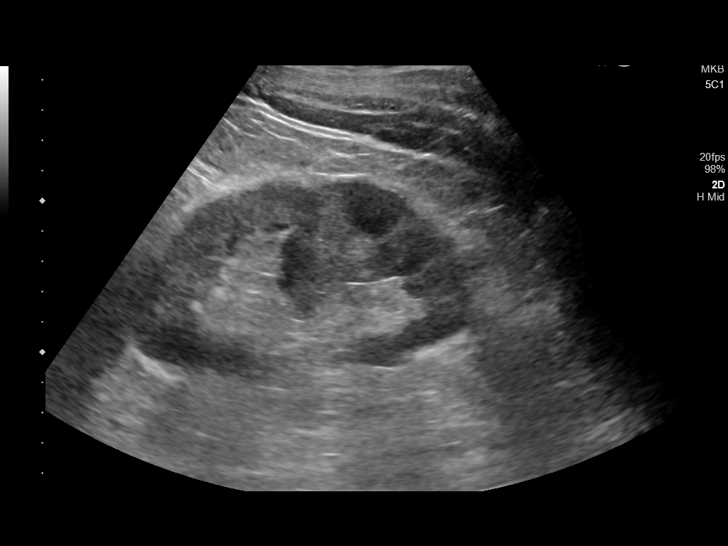
[im 35/47]
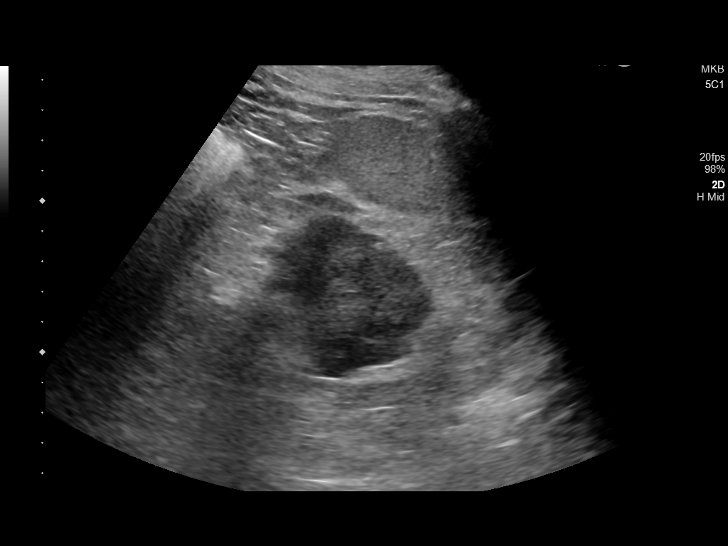
[im 39/47]
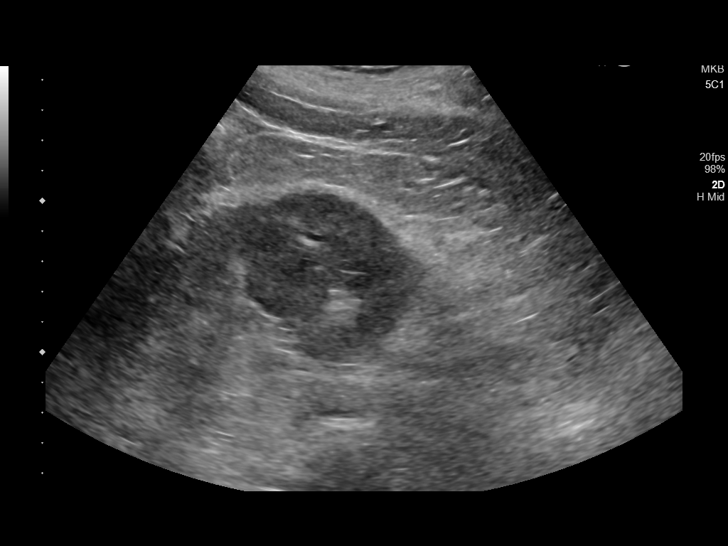
[im 43/47]
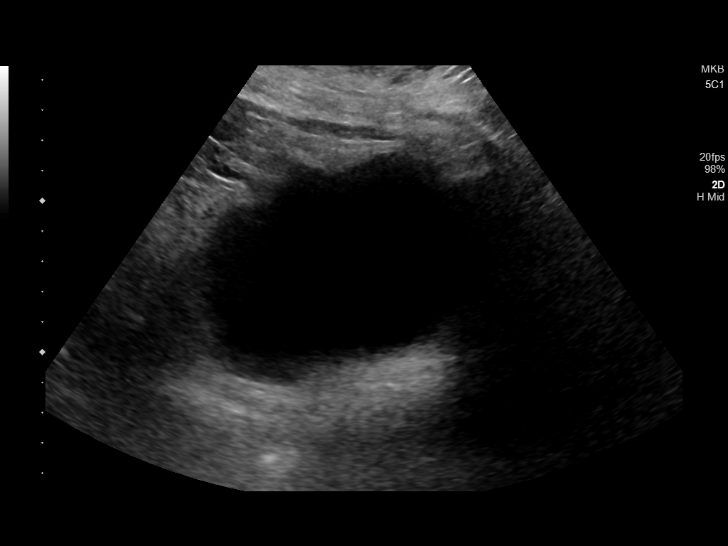
[im 47/47]
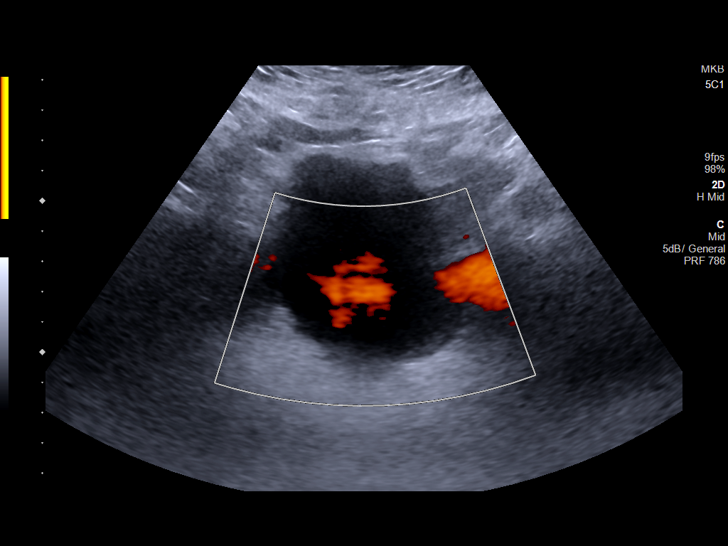

[14 of 25 positions shown; findings below may reference images not displayed]

FINDINGS: Right Kidney:

Renal measurements: 11.1 x 6.2 x 5.1 cm = volume: 186 mL. Contains
several calcifications. There is a prominent column of Bertin which
is symmetric to the left. No hydronephrosis.

Left Kidney:

Renal measurements: 11.5 x 5.5 x 6.0 cm = volume: 198.4 mL.
Echogenicity within normal limits. No mass or hydronephrosis
visualized. Prominent column of Bertin, symmetric to the right.

Bladder:

Appears normal for degree of bladder distention.

Other:

None.
IMPRESSION: 1. No acute abnormalities. Prominent columns of Bertin bilaterally,
of no significance.

## 2021-06-10 DIAGNOSIS — H524 Presbyopia: Secondary | ICD-10-CM | POA: Diagnosis not present

## 2021-06-10 DIAGNOSIS — H02834 Dermatochalasis of left upper eyelid: Secondary | ICD-10-CM | POA: Diagnosis not present

## 2021-06-10 DIAGNOSIS — H5203 Hypermetropia, bilateral: Secondary | ICD-10-CM | POA: Diagnosis not present

## 2021-06-10 DIAGNOSIS — H02831 Dermatochalasis of right upper eyelid: Secondary | ICD-10-CM | POA: Diagnosis not present

## 2021-06-10 DIAGNOSIS — H2513 Age-related nuclear cataract, bilateral: Secondary | ICD-10-CM | POA: Diagnosis not present

## 2021-06-24 DIAGNOSIS — M3320 Polymyositis, organ involvement unspecified: Secondary | ICD-10-CM | POA: Diagnosis not present

## 2021-06-24 DIAGNOSIS — M359 Systemic involvement of connective tissue, unspecified: Secondary | ICD-10-CM | POA: Diagnosis not present

## 2021-07-01 DIAGNOSIS — J849 Interstitial pulmonary disease, unspecified: Secondary | ICD-10-CM | POA: Diagnosis not present

## 2021-07-22 ENCOUNTER — Ambulatory Visit
Admission: EM | Admit: 2021-07-22 | Discharge: 2021-07-22 | Disposition: A | Discharge status: Home / Self Care | Payer: PPO | Attending: Family Medicine | Admitting: Family Medicine

## 2021-07-22 ENCOUNTER — Other Ambulatory Visit: Payer: Self-pay

## 2021-07-22 ENCOUNTER — Other Ambulatory Visit: Payer: Self-pay | Admitting: Family Medicine

## 2021-07-22 ENCOUNTER — Encounter: Payer: Self-pay | Admitting: Emergency Medicine

## 2021-07-22 DIAGNOSIS — B9789 Other viral agents as the cause of diseases classified elsewhere: Secondary | ICD-10-CM | POA: Diagnosis not present

## 2021-07-22 DIAGNOSIS — J988 Other specified respiratory disorders: Secondary | ICD-10-CM | POA: Diagnosis not present

## 2021-07-22 MED ORDER — PREDNISONE 20 MG PO TABS: 20.0000 mg | ORAL_TABLET | Freq: Every day | ORAL | 0 refills | Status: DC

## 2021-07-22 MED ORDER — PREDNISONE 20 MG PO TABS: 20.0000 mg | ORAL_TABLET | Freq: Every day | ORAL | 0 refills | Status: AC

## 2021-07-22 NOTE — ED Provider Notes (Signed)
[facility]    CSN: [id number] Arrival date & time: 07/22/21  1006      History   Chief Complaint Chief Complaint  Patient presents with   Nasal Congestion   Generalized Body Aches   Fatigue    HPI John Marks is a 81 y.o. male.   HPI Patient presents with URI symptoms of runny nose, post nasal drainage, and occasional cough. He has viral symptoms of bodyaches and subjective fever. No known exposure to COVID. Took a home COVID test which was negative.  He has a low grade temperature on arrival. Thinks he needs an antibiotic.  Past Medical History:  Diagnosis Date   Acid reflux    Arthritis    Atrial fibrillation (HCC)    DDD (degenerative disc disease), cervical    Hyperlipidemia    Hypertension    Prostate cancer [facility])     Patient Active Problem List   Diagnosis Date Noted   PAF (paroxysmal atrial fibrillation) ([place]) 03/09/2019   Disc disease, degenerative, lumbar or lumbosacral 12/19/2013   Diverticulosis 12/19/2013   HTN (hypertension) 12/19/2013   Hyperlipidemia 12/19/2013   Tinnitus 12/19/2013   Bladder outlet obstruction 12/22/2012   ED (erectile dysfunction) of organic origin 12/22/2012   Malignant neoplasm of prostate ([place]) 12/22/2012   Personal history of prostate cancer 12/22/2012    Past Surgical History:  Procedure Laterality Date   COLONOSCOPY WITH PROPOFOL N/A 08/01/2015   Procedure: COLONOSCOPY WITH PROPOFOL;  Surgeon: Manya Silvas, MD;  Location: [place];  Service: Endoscopy;  Laterality: N/A;   COLONOSCOPY WITH PROPOFOL N/A 01/22/2021   Procedure: COLONOSCOPY WITH PROPOFOL;  Surgeon: [place], Benay Pike, MD;  Location: [facility] ENDOSCOPY;  Service: Gastroenterology;  Laterality: N/A;   PERINEAL PROSTATECTOMY     pointer finger surgery Left 05/2019     Home Medications    Prior to Admission medications   Medication Sig Start Date End Date Taking? Authorizing Provider  acetaminophen-codeine (TYLENOL #3) 300-30 MG tablet  Take 1 tablet by mouth every 6 (six) hours as needed. 02/06/19   [provider]  albuterol (VENTOLIN HFA) 108 (90 Base) MCG/ACT inhaler Inhale 2 puffs into the lungs every 6 (six) hours as needed for wheezing or shortness of breath.    [provider]  apixaban (ELIQUIS) 5 MG TABS tablet Take 1 tablet (5 mg total) by mouth 2 (two) times daily. Patient not taking: Reported on 01/22/2021 01/29/18   Hinda Kehr, MD  aspirin 81 MG tablet Take 81 mg by mouth daily.    [provider]  atorvastatin (LIPITOR) 40 MG tablet Take 40 mg by mouth daily.    [provider]  Choline Fenofibrate 135 MG capsule Take 135 mg by mouth daily.    [provider]  flecainide (TAMBOCOR) 50 MG tablet Take 50 mg by mouth 2 (two) times daily. 03/03/19   [provider]  Fluticasone-Umeclidin-Vilant (TRELEGY ELLIPTA) 100-62.5-25 MCG/INH AEPB Inhale into the lungs.    [provider]  glucosamine-chondroitin 500-400 MG tablet Take 1 tablet by mouth 3 (three) times daily.    [provider]  meloxicam (MOBIC) 15 MG tablet Take 15 mg by mouth daily. 05/18/19   [provider]  metoprolol tartrate (LOPRESSOR) 25 MG tablet Take 1 tablet (25 mg total) by mouth 2 (two) times daily. 01/29/18 01/29/19  Hinda Kehr, MD  omeprazole (PRILOSEC) 20 MG capsule Take 20 mg by mouth daily.    [provider]  predniSONE (DELTASONE) 20 MG  tablet Take 1 tablet (20 mg total) by mouth daily with breakfast for 5 days. 07/22/21 07/27/21  Scot Jun, FNP    Family History History reviewed. No pertinent family history.  Social History Social History   Tobacco Use   Smoking status: Former    Packs/day: 2.00    Years: 30.00    Pack years: 60.00    Types: Cigarettes    Quit date: 07/21/1987    Years since quitting: 34.0   Smokeless tobacco: Never  Vaping Use   Vaping Use: Never used  Substance Use Topics   Alcohol use: Yes    Alcohol/week: 1.0  standard drink    Types: 1 Glasses of wine per week    Comment: 1-2 drinks per day   Drug use: No     Allergies   Latex  Review of Systems Review of Systems Pertinent negatives listed in HPI  Physical Exam Triage Vital Signs ED Triage Vitals  Enc Vitals Group     BP 07/22/21 1230 129/83     Pulse Rate 07/22/21 1230 62     Resp 07/22/21 1230 16     Temp 07/22/21 1230 99.1 F (37.3 C)     Temp Source 07/22/21 1230 Oral     SpO2 07/22/21 1230 94 %     Weight --      Height --      Head Circumference --      Peak Flow --      Pain Score 07/22/21 1229 0     Pain Loc --      Pain Edu? --      Excl. in [place]? --    No data found.  Updated Vital Signs BP 129/83 (BP Location: Left Arm)    Pulse 62    Temp 99.1 F (37.3 C) (Oral)    Resp 16    SpO2 94%   Visual Acuity Right Eye Distance:   Left Eye Distance:   Bilateral Distance:    Right Eye Near:   Left Eye Near:    Bilateral Near:     Physical Exam  General Appearance:    Alert, non-acutely ill appearing, no distress  HENT:   Normocephalic, ears normal, nares mucosal edema with congestion, rhinorrhea, oropharynx    Eyes:    PERRL, conjunctiva/corneas clear, EOM's intact       Lungs:     Clear to auscultation bilaterally, respirations unlabored  Heart:    Regular rate and rhythm  Neurologic:   Awake, alert, oriented x 3. No apparent focal neurological           defect.      UC Treatments / Results  Labs (all labs ordered are listed, but only abnormal results are displayed) Labs Reviewed - No data to display  EKG   Radiology No results found.  Procedures Procedures (including critical care time)  Medications Ordered in UC Medications - No data to display  Initial Impression / Assessment and Plan / UC Course  I have reviewed the triage vital signs and the nursing notes.  Pertinent labs & imaging results that were available during my care of the patient were reviewed by me and considered in my medical  decision making (see chart for details).    Symptom management warranted only.   Nasal symptoms with over-the-counter antihistamines recommended.  Treatment per discharge medications/discharge instructions.  Red flags/ER precautions given. RTC PRN.   Final Clinical Impressions(s) / UC Diagnoses   Final diagnoses:  Viral respiratory illness     Discharge Instructions      For symptom management start prednisone 20 mg with food daily for the next 5 days.  Also recommend adding Zyrtec or Claritin to help dry up the mucus once the inflammation in your sinuses resolved.  Drink plenty of water.  If you develop any cough you can continue the over-the-counter cough medication to help with that symptom.  Return as needed.     ED Prescriptions     Medication Sig Dispense Auth. Provider   predniSONE (DELTASONE) 20 MG tablet  (Status: Discontinued) Take 1 tablet (20 mg total) by mouth daily with breakfast for 5 days. 5 tablet Scot Jun, FNP   predniSONE (DELTASONE) 20 MG tablet Take 1 tablet (20 mg total) by mouth daily with breakfast for 5 days. 5 tablet Scot Jun, FNP      PDMP not reviewed this encounter.   Scot Jun, FNP 07/23/21 1356

## 2021-07-22 NOTE — ED Triage Notes (Signed)
Pt presents with runny nose and bodyaches x 5 days. Pt states he is improving but his respiratory symptoms are getting worse.

## 2021-07-22 NOTE — Discharge Instructions (Addendum)
For symptom management start prednisone 20 mg with food daily for the next 5 days.  Also recommend adding Zyrtec or Claritin to help dry up the mucus once the inflammation in your sinuses resolved.  Drink plenty of water.  If you develop any cough you can continue the over-the-counter cough medication to help with that symptom.  Return as needed.

## 2021-09-01 DIAGNOSIS — C44629 Squamous cell carcinoma of skin of left upper limb, including shoulder: Secondary | ICD-10-CM | POA: Diagnosis not present

## 2021-09-01 DIAGNOSIS — D485 Neoplasm of uncertain behavior of skin: Secondary | ICD-10-CM | POA: Diagnosis not present

## 2021-09-01 DIAGNOSIS — D0461 Carcinoma in situ of skin of right upper limb, including shoulder: Secondary | ICD-10-CM | POA: Diagnosis not present

## 2021-09-01 DIAGNOSIS — R208 Other disturbances of skin sensation: Secondary | ICD-10-CM | POA: Diagnosis not present

## 2021-09-11 DIAGNOSIS — C44629 Squamous cell carcinoma of skin of left upper limb, including shoulder: Secondary | ICD-10-CM | POA: Diagnosis not present

## 2021-09-11 DIAGNOSIS — L905 Scar conditions and fibrosis of skin: Secondary | ICD-10-CM | POA: Diagnosis not present

## 2021-09-22 DIAGNOSIS — Z0000 Encounter for general adult medical examination without abnormal findings: Secondary | ICD-10-CM | POA: Diagnosis not present

## 2021-09-22 DIAGNOSIS — Z125 Encounter for screening for malignant neoplasm of prostate: Secondary | ICD-10-CM | POA: Diagnosis not present

## 2021-09-22 DIAGNOSIS — Z79899 Other long term (current) drug therapy: Secondary | ICD-10-CM | POA: Diagnosis not present

## 2021-09-22 DIAGNOSIS — E538 Deficiency of other specified B group vitamins: Secondary | ICD-10-CM | POA: Diagnosis not present

## 2021-09-22 DIAGNOSIS — I480 Paroxysmal atrial fibrillation: Secondary | ICD-10-CM | POA: Diagnosis not present

## 2021-09-22 DIAGNOSIS — J849 Interstitial pulmonary disease, unspecified: Secondary | ICD-10-CM | POA: Diagnosis not present

## 2021-09-22 DIAGNOSIS — N1831 Chronic kidney disease, stage 3a: Secondary | ICD-10-CM | POA: Diagnosis not present

## 2021-09-22 DIAGNOSIS — M25512 Pain in left shoulder: Secondary | ICD-10-CM | POA: Diagnosis not present

## 2021-09-22 DIAGNOSIS — I10 Essential (primary) hypertension: Secondary | ICD-10-CM | POA: Diagnosis not present

## 2021-09-22 DIAGNOSIS — E782 Mixed hyperlipidemia: Secondary | ICD-10-CM | POA: Diagnosis not present

## 2021-09-25 DIAGNOSIS — D0461 Carcinoma in situ of skin of right upper limb, including shoulder: Secondary | ICD-10-CM | POA: Diagnosis not present

## 2021-09-30 DIAGNOSIS — M47812 Spondylosis without myelopathy or radiculopathy, cervical region: Secondary | ICD-10-CM | POA: Diagnosis not present

## 2021-09-30 DIAGNOSIS — G8929 Other chronic pain: Secondary | ICD-10-CM | POA: Diagnosis not present

## 2021-09-30 DIAGNOSIS — M7542 Impingement syndrome of left shoulder: Secondary | ICD-10-CM | POA: Diagnosis not present

## 2021-09-30 DIAGNOSIS — M19012 Primary osteoarthritis, left shoulder: Secondary | ICD-10-CM | POA: Diagnosis not present

## 2021-09-30 DIAGNOSIS — M25512 Pain in left shoulder: Secondary | ICD-10-CM | POA: Diagnosis not present

## 2021-09-30 DIAGNOSIS — M5030 Other cervical disc degeneration, unspecified cervical region: Secondary | ICD-10-CM | POA: Diagnosis not present

## 2021-09-30 DIAGNOSIS — M62838 Other muscle spasm: Secondary | ICD-10-CM | POA: Diagnosis not present

## 2021-09-30 DIAGNOSIS — M778 Other enthesopathies, not elsewhere classified: Secondary | ICD-10-CM | POA: Diagnosis not present

## 2021-09-30 DIAGNOSIS — M7552 Bursitis of left shoulder: Secondary | ICD-10-CM | POA: Diagnosis not present

## 2022-01-13 ENCOUNTER — Other Ambulatory Visit: Payer: Self-pay | Admitting: Neurosurgery

## 2022-01-13 DIAGNOSIS — M4807 Spinal stenosis, lumbosacral region: Secondary | ICD-10-CM | POA: Diagnosis not present

## 2022-01-13 DIAGNOSIS — R29818 Other symptoms and signs involving the nervous system: Secondary | ICD-10-CM | POA: Diagnosis not present

## 2022-01-18 ENCOUNTER — Ambulatory Visit
Admission: RE | Admit: 2022-01-18 | Discharge: 2022-01-18 | Disposition: A | Discharge status: Home / Self Care | Payer: PPO | Source: Ambulatory Visit | Attending: Neurosurgery | Admitting: Neurosurgery

## 2022-01-18 DIAGNOSIS — M4807 Spinal stenosis, lumbosacral region: Secondary | ICD-10-CM | POA: Diagnosis not present

## 2022-01-18 DIAGNOSIS — M5126 Other intervertebral disc displacement, lumbar region: Secondary | ICD-10-CM | POA: Diagnosis not present

## 2022-01-23 ENCOUNTER — Telehealth: Payer: Self-pay

## 2022-01-23 NOTE — Telephone Encounter (Signed)
Order for his MRI Lumbar spine has been sent and patient has already had this completed on 01/18/22

## 2022-02-04 DIAGNOSIS — I480 Paroxysmal atrial fibrillation: Secondary | ICD-10-CM | POA: Diagnosis not present

## 2022-02-04 DIAGNOSIS — I10 Essential (primary) hypertension: Secondary | ICD-10-CM | POA: Diagnosis not present

## 2022-02-20 IMAGING — CT CT CHEST W/ CM
2 of 4 series · 15 of 36 positions shown, 18 images · IV contrast (omnipaque)
Comparison: Chest radiograph 01/29/2018

CLINICAL DATA: Follow-up abnormal chest radiograph. Shortness of
breath for 5 months.

EXAM:
CT CHEST WITH CONTRAST
TECHNIQUE: Multidetector CT imaging of the chest was performed during
intravenous contrast administration.
CONTRAST:  75mL OMNIPAQUE IOHEXOL 300 MG/ML  SOLN

[Series 2: axial chest 2.00 · axial · 0.71mm/px · z∈[-1042,-734]mm · 12 of 183 slices shown, 15 images]
[im 15/183  mediastinal]
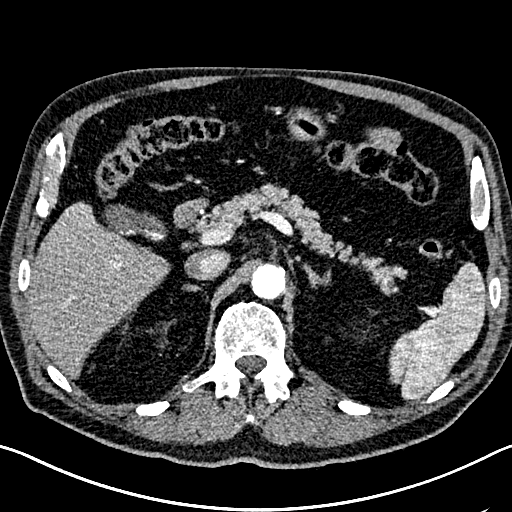
[im 15/183  lung]
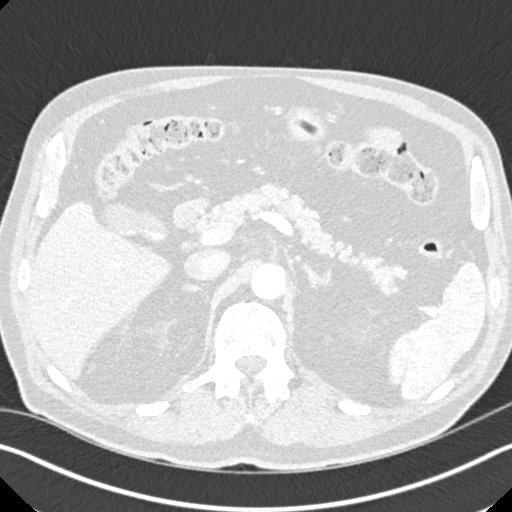
[im 29/183  lung]
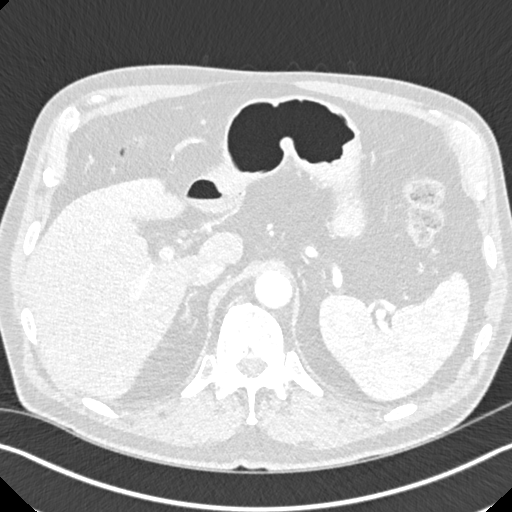
[im 43/183  lung]
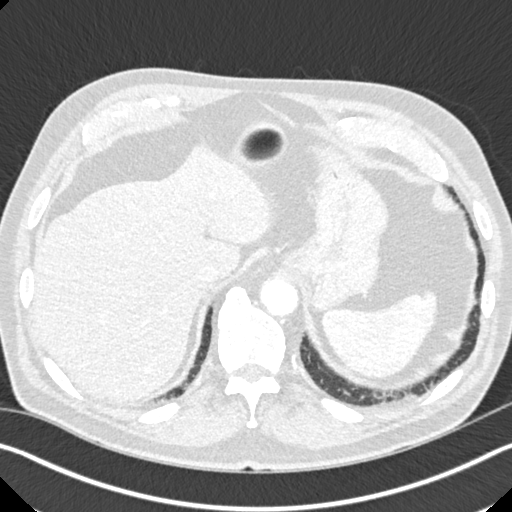
[im 57/183  lung]
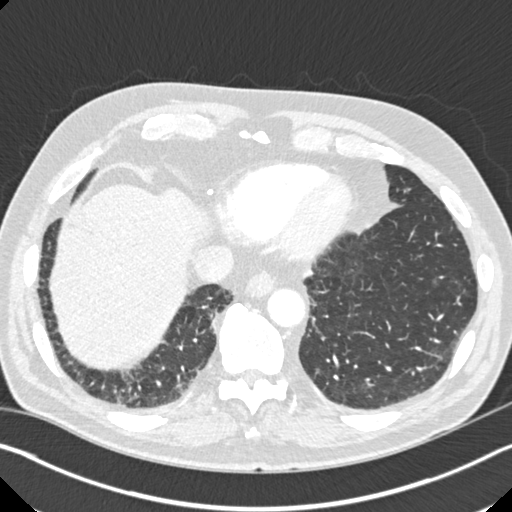
[im 71/183  mediastinal]
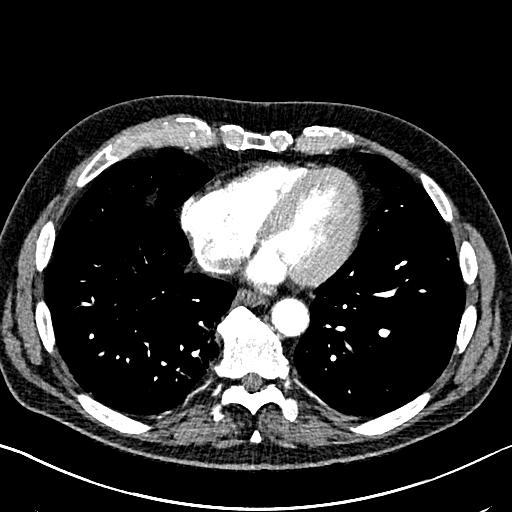
[im 71/183  lung]
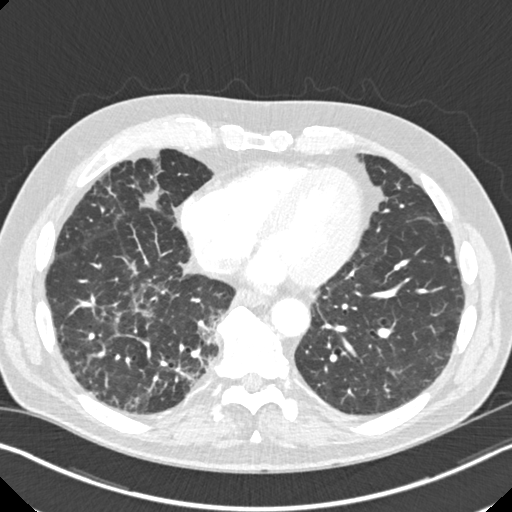
[im 85/183  lung]
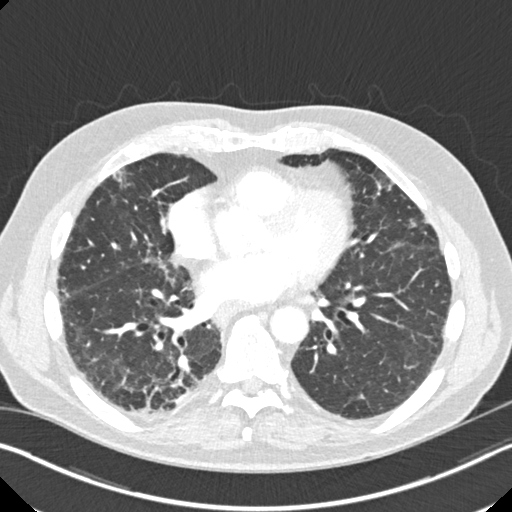
[im 99/183  lung]
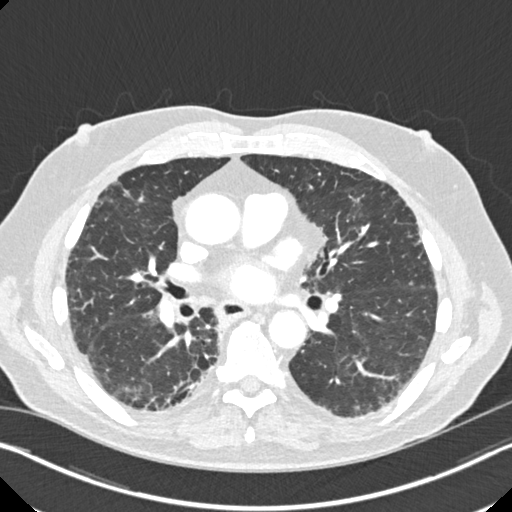
[im 113/183  lung]
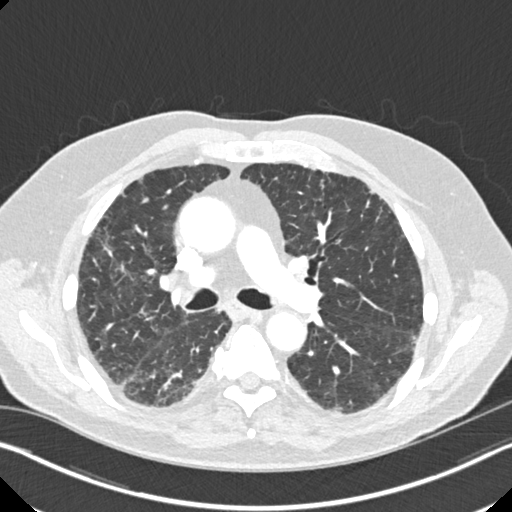
[im 127/183  mediastinal]
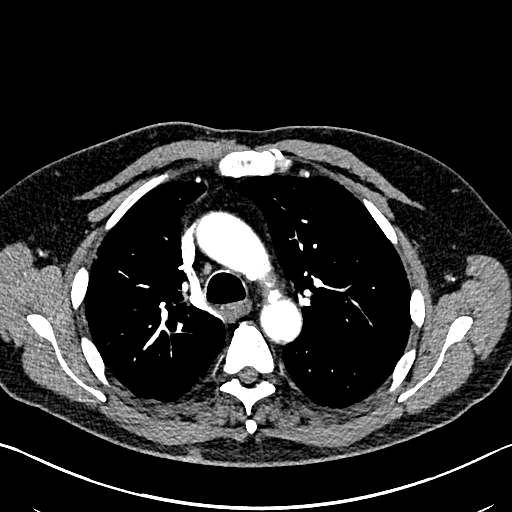
[im 127/183  lung]
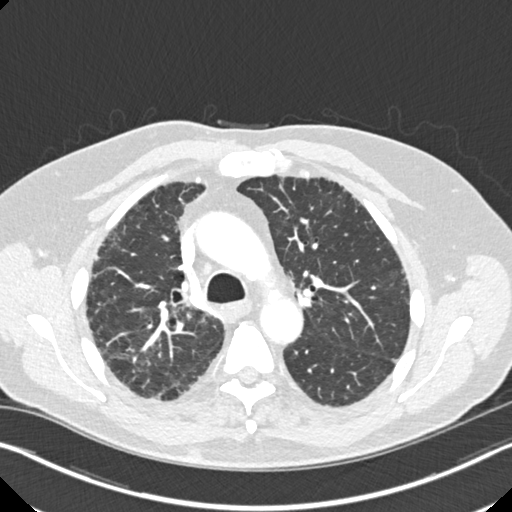
[im 141/183  lung]
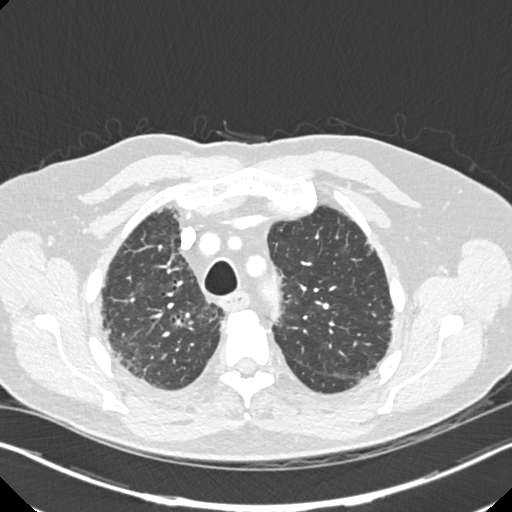
[im 155/183  lung]
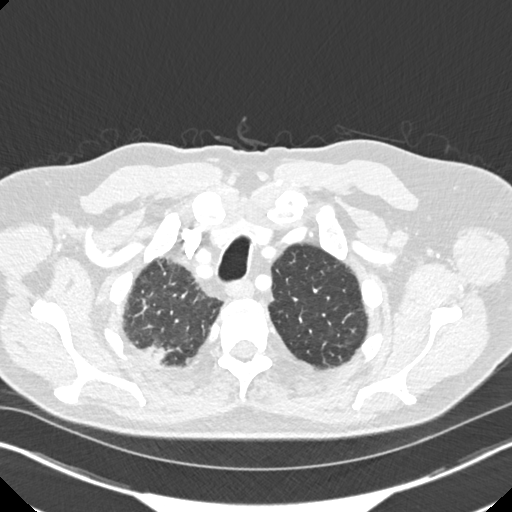
[im 169/183  lung]
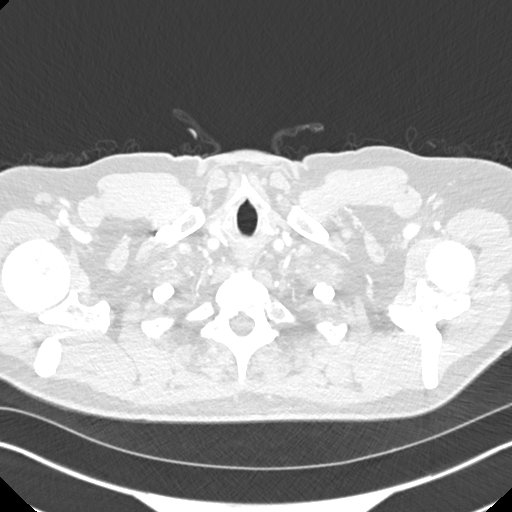

[Series 5: coronal chest 2.00 cor · coronal · 0.72mm/px · 3 of 144 slices shown]
[im 29/144  lung]
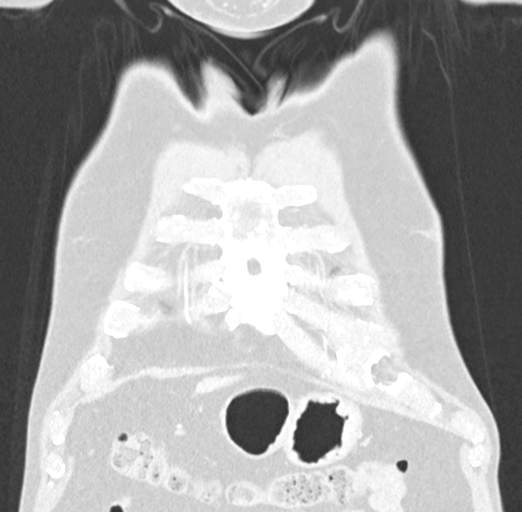
[im 58/144  lung]
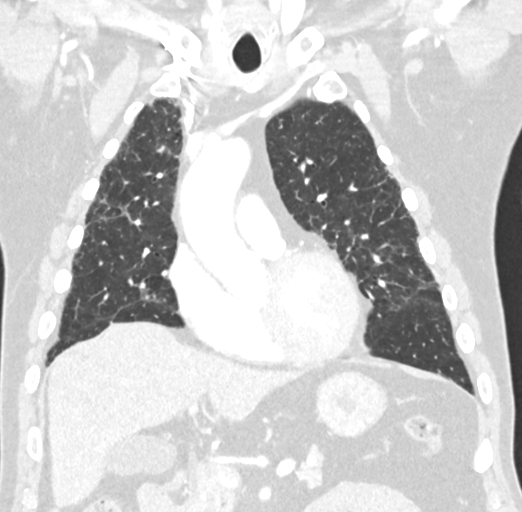
[im 86/144  lung]
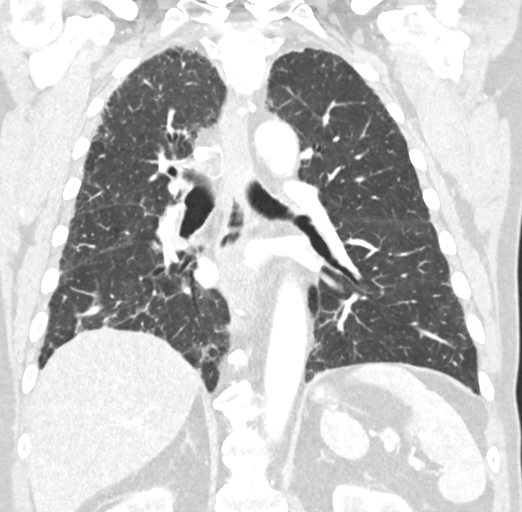

[15 of 36 positions shown; findings below may reference images not displayed]

FINDINGS: Cardiovascular: Normal heart size. No pericardial effusions. Dilated
ascending thoracic aorta measuring 3.9 cm in diameter. No aortic
dissection. Great vessel origins are patent. Pulmonary arteries are
well opacified without evidence of significant pulmonary embolus.

Mediastinum/Nodes: Esophagus is decompressed. No significant
lymphadenopathy in the chest. Thyroid gland is unremarkable.

Lungs/Pleura: Peripheral and basilar fibrosis and scarring in the
lungs with mild bronchiectasis. Changes are nonspecific but likely
represent usual interstitial pneumonitis. There is pleural
thickening and atelectasis in both lung apices with calcified
granulomas present. No airspace disease or consolidation. No pleural
effusions. No pneumothorax.

Upper Abdomen: Cholelithiasis.

Musculoskeletal: Degenerative changes in the spine. No destructive
bone lesions.
IMPRESSION: 1. No evidence of significant pulmonary embolus.
2. Peripheral and basilar fibrosis and scarring in the lungs with
mild bronchiectasis. Changes are nonspecific but likely represent
usual interstitial pneumonitis.
3. Cholelithiasis.
4. Dilated ascending thoracic aorta measuring 3.9 cm in diameter.
5. Aortic atherosclerosis.

Aortic Atherosclerosis ([id number]).

## 2022-02-24 ENCOUNTER — Ambulatory Visit (INDEPENDENT_AMBULATORY_CARE_PROVIDER_SITE_OTHER): Payer: PPO | Admitting: Neurosurgery

## 2022-02-24 DIAGNOSIS — M5416 Radiculopathy, lumbar region: Secondary | ICD-10-CM

## 2022-02-24 NOTE — Progress Notes (Signed)
I spoke with John Marks today to review his MRI results (see below).  Since started physical therapy and underwent a right hip injection for bursitis which has significantly helped with the symptoms.  He is no longer having radiating leg pain and states that he is doing very well.  MRI L spine 01/18/22  IMPRESSION: 1. Slight worsening of moderate left L5-S1 neural foraminal stenosis. 2. Unchanged moderate bilateral L4-L5 neural foraminal stenosis. 3. Postsurgical change of resection of right subarticular L3-4 disc herniation without residual spinal canal or neural foraminal stenosis.     Electronically Signed   By: Ulyses Jarred M.D.   On: 01/20/2022 03:32   Plan: We discussed his MRI results however he is not symptomatic at this time.  I encouraged him to continue with physical therapy as needed. We did discuss that there may be some utility to ESI's should his symptoms recur.  I will otherwise see him going forward on an as-needed basis.  He was encouraged to call our office with any questions or concerns.  He expressed understanding was in agreement with this plan.

## 2022-04-01 DIAGNOSIS — Z125 Encounter for screening for malignant neoplasm of prostate: Secondary | ICD-10-CM | POA: Diagnosis not present

## 2022-04-01 DIAGNOSIS — J849 Interstitial pulmonary disease, unspecified: Secondary | ICD-10-CM | POA: Diagnosis not present

## 2022-04-01 DIAGNOSIS — E782 Mixed hyperlipidemia: Secondary | ICD-10-CM | POA: Diagnosis not present

## 2022-04-01 DIAGNOSIS — Z8546 Personal history of malignant neoplasm of prostate: Secondary | ICD-10-CM | POA: Diagnosis not present

## 2022-04-01 DIAGNOSIS — Z79899 Other long term (current) drug therapy: Secondary | ICD-10-CM | POA: Diagnosis not present

## 2022-04-01 DIAGNOSIS — Z0000 Encounter for general adult medical examination without abnormal findings: Secondary | ICD-10-CM | POA: Diagnosis not present

## 2022-04-01 DIAGNOSIS — I480 Paroxysmal atrial fibrillation: Secondary | ICD-10-CM | POA: Diagnosis not present

## 2022-04-01 DIAGNOSIS — I10 Essential (primary) hypertension: Secondary | ICD-10-CM | POA: Diagnosis not present

## 2022-04-06 DIAGNOSIS — M13852 Other specified arthritis, left hip: Secondary | ICD-10-CM | POA: Diagnosis not present

## 2022-04-06 DIAGNOSIS — M7062 Trochanteric bursitis, left hip: Secondary | ICD-10-CM | POA: Diagnosis not present

## 2022-04-06 DIAGNOSIS — M25552 Pain in left hip: Secondary | ICD-10-CM | POA: Diagnosis not present

## 2022-04-06 DIAGNOSIS — S7002XA Contusion of left hip, initial encounter: Secondary | ICD-10-CM | POA: Diagnosis not present

## 2022-04-29 ENCOUNTER — Ambulatory Visit
Admission: EM | Admit: 2022-04-29 | Discharge: 2022-04-29 | Disposition: A | Discharge status: Home / Self Care | Payer: PPO | Attending: Emergency Medicine | Admitting: Emergency Medicine

## 2022-04-29 DIAGNOSIS — J0100 Acute maxillary sinusitis, unspecified: Secondary | ICD-10-CM | POA: Diagnosis not present

## 2022-04-29 MED ORDER — AZITHROMYCIN 250 MG PO TABS: 250.0000 mg | ORAL_TABLET | Freq: Every day | ORAL | 0 refills | Status: DC

## 2022-04-29 NOTE — Discharge Instructions (Addendum)
Take the Zithromax as directed.  Follow up with your primary care provider if your symptoms are not improving.    

## 2022-04-29 NOTE — ED Provider Notes (Signed)
[facility]    CSN: [id number] Arrival date & time: 04/29/22  1121      History   Chief Complaint Chief Complaint  Patient presents with   Headache    HPI John Marks is a 81 y.o. male.  Patient presents with sinus congestion, sinus pain, yellow mucus, postnasal drip, cough x5 days.  He returned last night from a trip to [place].  He denies fever, chills, rash, sore throat, shortness of breath, vomiting, diarrhea, or other symptoms.  His medical history includes hypertension, atrial fibrillation, GERD, diverticulosis, prostate cancer, arthritis, DDD.  The history is provided by the patient and medical records.    Past Medical History:  Diagnosis Date   Acid reflux    Arthritis    Atrial fibrillation (HCC)    DDD (degenerative disc disease), cervical    Hyperlipidemia    Hypertension    Prostate cancer [facility])     Patient Active Problem List   Diagnosis Date Noted   PAF (paroxysmal atrial fibrillation) ([place]) 03/09/2019   Disc disease, degenerative, lumbar or lumbosacral 12/19/2013   Diverticulosis 12/19/2013   HTN (hypertension) 12/19/2013   Hyperlipidemia 12/19/2013   Tinnitus 12/19/2013   Bladder outlet obstruction 12/22/2012   ED (erectile dysfunction) of organic origin 12/22/2012   Malignant neoplasm of prostate ([place]) 12/22/2012   Personal history of prostate cancer 12/22/2012    Past Surgical History:  Procedure Laterality Date   COLONOSCOPY WITH PROPOFOL N/A 08/01/2015   Procedure: COLONOSCOPY WITH PROPOFOL;  Surgeon: Manya Silvas, MD;  Location: [place];  Service: Endoscopy;  Laterality: N/A;   COLONOSCOPY WITH PROPOFOL N/A 01/22/2021   Procedure: COLONOSCOPY WITH PROPOFOL;  Surgeon: Toledo, Benay Pike, MD;  Location: [facility] ENDOSCOPY;  Service: Gastroenterology;  Laterality: N/A;   PERINEAL PROSTATECTOMY     pointer finger surgery Left 05/2019       Home Medications    Prior to Admission medications   Medication Sig Start Date End  Date Taking? Authorizing Provider  azithromycin (ZITHROMAX) 250 MG tablet Take 1 tablet (250 mg total) by mouth daily. Take first 2 tablets together, then 1 every day until finished. 04/29/22  Yes Sharion Balloon, NP  acetaminophen-codeine (TYLENOL #3) 300-30 MG tablet Take 1 tablet by mouth every 6 (six) hours as needed. 02/06/19   [provider]  albuterol (VENTOLIN HFA) 108 (90 Base) MCG/ACT inhaler Inhale 2 puffs into the lungs every 6 (six) hours as needed for wheezing or shortness of breath.    [provider]  apixaban (ELIQUIS) 5 MG TABS tablet Take 1 tablet (5 mg total) by mouth 2 (two) times daily. Patient not taking: Reported on 01/22/2021 01/29/18   Hinda Kehr, MD  aspirin 81 MG tablet Take 81 mg by mouth daily.    [provider]  atorvastatin (LIPITOR) 40 MG tablet Take 40 mg by mouth daily.    [provider]  Choline Fenofibrate 135 MG capsule Take 135 mg by mouth daily.    [provider]  flecainide (TAMBOCOR) 50 MG tablet Take 50 mg by mouth 2 (two) times daily. 03/03/19   [provider]  Fluticasone-Umeclidin-Vilant (TRELEGY ELLIPTA) 100-62.5-25 MCG/INH AEPB Inhale into the lungs.    [provider]  glucosamine-chondroitin 500-400 MG tablet Take 1 tablet by mouth 3 (three) times daily.    [provider]  meloxicam (MOBIC) 15 MG tablet Take 15 mg by mouth daily. 05/18/19   [provider]  metoprolol tartrate (LOPRESSOR) 25 MG tablet  Take 1 tablet (25 mg total) by mouth 2 (two) times daily. 01/29/18 01/29/19  Hinda Kehr, MD  omeprazole (PRILOSEC) 20 MG capsule Take 20 mg by mouth daily.    [provider]    Family History History reviewed. No pertinent family history.  Social History Social History   Tobacco Use   Smoking status: Former    Packs/day: 2.00    Years: 30.00    Total pack years: 60.00    Types: Cigarettes    Quit date: 07/21/1987    Years since quitting: 34.7    Smokeless tobacco: Never  Vaping Use   Vaping Use: Never used  Substance Use Topics   Alcohol use: Yes    Alcohol/week: 1.0 standard drink of alcohol    Types: 1 Glasses of wine per week    Comment: 1-2 drinks per day   Drug use: No     Allergies   Latex   Review of Systems Review of Systems  Constitutional:  Negative for chills and fever.  HENT:  Positive for congestion, postnasal drip, rhinorrhea, sinus pressure and sinus pain. Negative for ear pain and sore throat.   Respiratory:  Positive for cough. Negative for shortness of breath.   Cardiovascular:  Negative for chest pain and palpitations.  Gastrointestinal:  Negative for diarrhea and vomiting.  Skin:  Negative for color change and rash.  All other systems reviewed and are negative.    Physical Exam Triage Vital Signs ED Triage Vitals  Enc Vitals Group     BP 04/29/22 1233 125/76     Pulse Rate 04/29/22 1233 80     Resp 04/29/22 1233 18     Temp 04/29/22 1233 98.2 F (36.8 C)     Temp src --      SpO2 04/29/22 1233 96 %     Weight 04/29/22 1239 195 lb (88.5 kg)     Height 04/29/22 1239 '5\' 11"'$  (1.803 m)     Head Circumference --      Peak Flow --      Pain Score 04/29/22 1238 5     Pain Loc --      Pain Edu? --      Excl. in [place]? --    No data found.  Updated Vital Signs BP 125/76   Pulse 80   Temp 98.2 F (36.8 C)   Resp 18   Ht '5\' 11"'$  (1.803 m)   Wt 195 lb (88.5 kg)   SpO2 96%   BMI 27.20 kg/m   Visual Acuity Right Eye Distance:   Left Eye Distance:   Bilateral Distance:    Right Eye Near:   Left Eye Near:    Bilateral Near:     Physical Exam Vitals and nursing note reviewed.  Constitutional:      General: He is not in acute distress.    Appearance: Normal appearance. He is well-developed. He is not ill-appearing.  HENT:     Right Ear: Tympanic membrane normal.     Left Ear: Tympanic membrane normal.     Nose: Congestion present.     Mouth/Throat:     Mouth: Mucous membranes are  moist.     Pharynx: Oropharynx is clear.  Eyes:     Conjunctiva/sclera: Conjunctivae normal.  Cardiovascular:     Rate and Rhythm: Normal rate and regular rhythm.     Heart sounds: Normal heart sounds.  Pulmonary:     Effort: Pulmonary effort is normal. No respiratory distress.  Breath sounds: Normal breath sounds. No wheezing, rhonchi or rales.  Musculoskeletal:     Cervical back: Neck supple.  Skin:    General: Skin is warm and dry.  Neurological:     Mental Status: He is alert.  Psychiatric:        Mood and Affect: Mood normal.        Behavior: Behavior normal.      UC Treatments / Results  Labs (all labs ordered are listed, but only abnormal results are displayed) Labs Reviewed - No data to display  EKG   Radiology No results found.  Procedures Procedures (including critical care time)  Medications Ordered in UC Medications - No data to display  Initial Impression / Assessment and Plan / UC Course  I have reviewed the triage vital signs and the nursing notes.  Pertinent labs & imaging results that were available during my care of the patient were reviewed by me and considered in my medical decision making (see chart for details).    Acute sinusitis.  Patient has been symptomatic for 5 days and is not improving with OTC treatment.  He returned last night from a trip to [place].  Treating today with Zithromax as patient reports this is what usually works well for him.  Tylenol as needed for discomfort.  Education provided on sinusitis.  Instructed him to follow-up with his PCP if his symptoms are not improving.  He agrees to plan of care.     Final Clinical Impressions(s) / UC Diagnoses   Final diagnoses:  Acute non-recurrent maxillary sinusitis     Discharge Instructions      Take the Zithromax as directed.  Follow up with your primary care provider if your symptoms are not improving.        ED Prescriptions     Medication Sig Dispense Auth.  Provider   azithromycin (ZITHROMAX) 250 MG tablet Take 1 tablet (250 mg total) by mouth daily. Take first 2 tablets together, then 1 every day until finished. 6 tablet Sharion Balloon, NP      PDMP not reviewed this encounter.   Sharion Balloon, NP 04/29/22 1323

## 2022-04-29 NOTE — ED Triage Notes (Signed)
Patient to Urgent Care with complaints of sinus pain/ congestion yellow mucus production and post nasal drip. Reports symptoms started on Saturday and have continued to worsened. Denies any possible fevers. Recently returned from [place].

## 2022-05-11 DIAGNOSIS — I480 Paroxysmal atrial fibrillation: Secondary | ICD-10-CM | POA: Diagnosis not present

## 2022-05-11 DIAGNOSIS — E782 Mixed hyperlipidemia: Secondary | ICD-10-CM | POA: Diagnosis not present

## 2022-05-11 DIAGNOSIS — I10 Essential (primary) hypertension: Secondary | ICD-10-CM | POA: Diagnosis not present

## 2022-05-13 DIAGNOSIS — D485 Neoplasm of uncertain behavior of skin: Secondary | ICD-10-CM | POA: Diagnosis not present

## 2022-05-13 DIAGNOSIS — L570 Actinic keratosis: Secondary | ICD-10-CM | POA: Diagnosis not present

## 2022-05-13 DIAGNOSIS — C44612 Basal cell carcinoma of skin of right upper limb, including shoulder: Secondary | ICD-10-CM | POA: Diagnosis not present

## 2022-05-13 DIAGNOSIS — D2272 Melanocytic nevi of left lower limb, including hip: Secondary | ICD-10-CM | POA: Diagnosis not present

## 2022-05-13 DIAGNOSIS — D2261 Melanocytic nevi of right upper limb, including shoulder: Secondary | ICD-10-CM | POA: Diagnosis not present

## 2022-05-13 DIAGNOSIS — Z85828 Personal history of other malignant neoplasm of skin: Secondary | ICD-10-CM | POA: Diagnosis not present

## 2022-05-13 DIAGNOSIS — D2262 Melanocytic nevi of left upper limb, including shoulder: Secondary | ICD-10-CM | POA: Diagnosis not present

## 2022-05-13 DIAGNOSIS — X32XXXA Exposure to sunlight, initial encounter: Secondary | ICD-10-CM | POA: Diagnosis not present

## 2022-06-16 DIAGNOSIS — H02834 Dermatochalasis of left upper eyelid: Secondary | ICD-10-CM | POA: Diagnosis not present

## 2022-06-16 DIAGNOSIS — H2513 Age-related nuclear cataract, bilateral: Secondary | ICD-10-CM | POA: Diagnosis not present

## 2022-06-16 DIAGNOSIS — H524 Presbyopia: Secondary | ICD-10-CM | POA: Diagnosis not present

## 2022-06-16 DIAGNOSIS — H5203 Hypermetropia, bilateral: Secondary | ICD-10-CM | POA: Diagnosis not present

## 2022-06-16 DIAGNOSIS — H02831 Dermatochalasis of right upper eyelid: Secondary | ICD-10-CM | POA: Diagnosis not present

## 2022-06-20 ENCOUNTER — Ambulatory Visit: Admit: 2022-06-20 | Disposition: A | Discharge status: Home / Self Care | Payer: PPO

## 2022-06-20 DIAGNOSIS — L0291 Cutaneous abscess, unspecified: Secondary | ICD-10-CM | POA: Diagnosis not present

## 2022-06-24 DIAGNOSIS — C44612 Basal cell carcinoma of skin of right upper limb, including shoulder: Secondary | ICD-10-CM | POA: Diagnosis not present

## 2022-06-29 DIAGNOSIS — L720 Epidermal cyst: Secondary | ICD-10-CM | POA: Diagnosis not present

## 2022-07-16 ENCOUNTER — Emergency Department: Payer: PPO

## 2022-07-16 ENCOUNTER — Other Ambulatory Visit: Payer: Self-pay

## 2022-07-16 ENCOUNTER — Emergency Department
Admission: EM | Admit: 2022-07-16 | Discharge: 2022-07-17 | Discharge status: Against Medical Advice | Payer: PPO | Attending: Emergency Medicine | Admitting: Emergency Medicine

## 2022-07-16 DIAGNOSIS — I4891 Unspecified atrial fibrillation: Secondary | ICD-10-CM | POA: Diagnosis not present

## 2022-07-16 DIAGNOSIS — R202 Paresthesia of skin: Secondary | ICD-10-CM | POA: Diagnosis not present

## 2022-07-16 DIAGNOSIS — J984 Other disorders of lung: Secondary | ICD-10-CM | POA: Diagnosis not present

## 2022-07-16 DIAGNOSIS — Z5321 Procedure and treatment not carried out due to patient leaving prior to being seen by health care provider: Secondary | ICD-10-CM | POA: Insufficient documentation

## 2022-07-16 DIAGNOSIS — R0602 Shortness of breath: Secondary | ICD-10-CM | POA: Diagnosis not present

## 2022-07-16 DIAGNOSIS — I700 Atherosclerosis of aorta: Secondary | ICD-10-CM | POA: Diagnosis not present

## 2022-07-16 LAB — TROPONIN I (HIGH SENSITIVITY): Troponin I (High Sensitivity): 13 ng/L (ref <18)

## 2022-07-16 LAB — BASIC METABOLIC PANEL
Anion gap: 6 (ref 5–15)
BUN: 23 mg/dL (ref 8–23)
CO2: 22 mmol/L (ref 22–32)
Calcium: 9 mg/dL (ref 8.9–10.3)
Chloride: 111 mmol/L (ref 98–111)
Creatinine, Ser: 1.16 mg/dL (ref 0.61–1.24)
GFR, Estimated: >60 mL/min (ref >60)
Glucose, Bld: 109 mg/dL — ABNORMAL HIGH (ref 70–99)
Potassium: 3.8 mmol/L (ref 3.5–5.1)
Sodium: 139 mmol/L (ref 135–145)

## 2022-07-16 LAB — CBC
HCT: 42.2 % (ref 39.0–52.0)
Hemoglobin: 13.9 g/dL (ref 13.0–17.0)
MCH: 30 pg (ref 26.0–34.0)
MCHC: 32.9 g/dL (ref 30.0–36.0)
MCV: 91.1 fL (ref 80.0–100.0)
Platelets: 181 10*3/uL (ref 150–400)
RBC: 4.63 MIL/uL (ref 4.22–5.81)
RDW: 13.3 % (ref 11.5–15.5)
WBC: 7 10*3/uL (ref 4.0–10.5)
nRBC: 0 % (ref 0.0–0.2)

## 2022-07-16 LAB — PROTIME-INR
INR: 1.1 (ref 0.8–1.2)
Prothrombin Time: 14.3 seconds (ref 11.4–15.2)

## 2022-07-16 NOTE — ED Triage Notes (Signed)
Reports watch notified him of AFIB while watching tv. Reports hx of same and compliant with medications at home. Denies episode of afib for past 5 years. Reports L sided chest pressure and tingling in L arm. Also reports associated SOB. Pt alert and oriented following commands. Breathing unlabored speaking in full sentences. Denies h/a but reports feeling light headed. Denies n/v.

## 2022-07-17 LAB — TSH: TSH: 5.351 u[IU]/mL — ABNORMAL HIGH (ref 0.350–4.500)

## 2022-07-17 LAB — MAGNESIUM: Magnesium: 2.1 mg/dL (ref 1.7–2.4)

## 2022-07-17 NOTE — ED Notes (Signed)
No answer when called several times from lobby 

## 2022-08-21 ENCOUNTER — Encounter: Payer: Self-pay | Admitting: Cardiology

## 2022-08-21 ENCOUNTER — Ambulatory Visit: Payer: Medicare HMO | Attending: Cardiology | Admitting: Cardiology

## 2022-08-21 ENCOUNTER — Other Ambulatory Visit
Admission: RE | Admit: 2022-08-21 | Discharge: 2022-08-21 | Disposition: A | Discharge status: Home / Self Care | Payer: Medicare HMO | Attending: Cardiology | Admitting: Cardiology

## 2022-08-21 VITALS — BP 130/62 | HR 49 | Ht 71.0 in | Wt 203.2 lb

## 2022-08-21 DIAGNOSIS — I480 Paroxysmal atrial fibrillation: Secondary | ICD-10-CM

## 2022-08-21 DIAGNOSIS — E7800 Pure hypercholesterolemia, unspecified: Secondary | ICD-10-CM | POA: Diagnosis not present

## 2022-08-21 DIAGNOSIS — R0609 Other forms of dyspnea: Secondary | ICD-10-CM | POA: Insufficient documentation

## 2022-08-21 LAB — BASIC METABOLIC PANEL
Anion gap: 7 (ref 5–15)
BUN: 21 mg/dL (ref 8–23)
CO2: 26 mmol/L (ref 22–32)
Calcium: 9 mg/dL (ref 8.9–10.3)
Chloride: 104 mmol/L (ref 98–111)
Creatinine, Ser: 1.28 mg/dL — ABNORMAL HIGH (ref 0.61–1.24)
GFR, Estimated: 56 mL/min — ABNORMAL LOW (ref >60)
Glucose, Bld: 100 mg/dL — ABNORMAL HIGH (ref 70–99)
Potassium: 4.7 mmol/L (ref 3.5–5.1)
Sodium: 137 mmol/L (ref 135–145)

## 2022-08-21 NOTE — Progress Notes (Signed)
Cardiology Office Note:    Date:  08/21/2022   ID:  John Marks, DOB 1941/05/30, MRN [id number]  PCP:  Idelle Crouch, MD   [facility] Prov[facility] Cardiologist:  Kate Sable, MD     Referring MD: Idelle Crouch, MD   Chief Complaint  Patient presents with   New Patient (Initial Visit)    Afib, ED follow up, left arm pain unsure if its heart related, Afib Hx     History of Present Illness:    John Marks is a 82 y.o. male with a hx of hypertension, paroxysmal atrial fibrillation, hyperlipidemia, hypertension, former smoker x 30+ years presenting with left arm pain.  Previously seen by [facility] cardiology from a cardiac perspective.  Diagnosed with atrial fibrillation in 2021 in the setting of diarrhea.  Started on flecainide and metoprolol.  No further recurrence of A-fib was noted, Eliquis, flecainide was stopped last summer/July 2023.  6 months later in December 2020 2020, patient developed symptoms of palpitations.  Presented to the ED, EKG showed A-fib RVR.  Restarted Lopressor, flecainide with resumption of sinus rhythm.  Followed up with PCP who restarted Eliquis.  Endorses shortness of breath with exertion, previous chest CT showed pulmonary fibrosis.  Stress echo 02/2020 EF over 55%, normal exam.  Past Medical History:  Diagnosis Date   Acid reflux    Arthritis    Atrial fibrillation ([place])    DDD (degenerative disc disease), cervical    Hyperlipidemia    Hypertension    Prostate cancer [facility])     Past Surgical History:  Procedure Laterality Date   COLONOSCOPY WITH PROPOFOL N/A 08/01/2015   Procedure: COLONOSCOPY WITH PROPOFOL;  Surgeon: Manya Silvas, MD;  Location: [place];  Service: Endoscopy;  Laterality: N/A;   COLONOSCOPY WITH PROPOFOL N/A 01/22/2021   Procedure: COLONOSCOPY WITH PROPOFOL;  Surgeon: Toledo, Benay Pike, MD;  Location: [facility] ENDOSCOPY;  Service: Gastroenterology;  Laterality: N/A;   PERINEAL PROSTATECTOMY     pointer  finger surgery Left 05/2019    Current Medications: Current Meds  Medication Sig   apixaban (ELIQUIS) 5 MG TABS tablet Take 1 tablet (5 mg total) by mouth 2 (two) times daily.   atorvastatin (LIPITOR) 40 MG tablet Take 40 mg by mouth daily.   flecainide (TAMBOCOR) 50 MG tablet Take 50 mg by mouth 2 (two) times daily.   glucosamine-chondroitin 500-400 MG tablet Take 1 tablet by mouth 3 (three) times daily.   meloxicam (MOBIC) 15 MG tablet Take 15 mg by mouth daily.   [DISCONTINUED] metoprolol tartrate (LOPRESSOR) 25 MG tablet Take 1 tablet (25 mg total) by mouth 2 (two) times daily. (Patient taking differently: Take 25 mg by mouth 2 (two) times daily. 12.'5MG'$  twice daily)     Allergies:   Latex   Social History   Socioeconomic History   Marital status: Married    Spouse name: Not on file   Number of children: Not on file   Years of education: Not on file   Highest education level: Not on file  Occupational History   Not on file  Tobacco Use   Smoking status: Former    Packs/day: 2.00    Years: 30.00    Total pack years: 60.00    Types: Cigarettes    Quit date: 07/21/1987    Years since quitting: 35.1   Smokeless tobacco: Never  Vaping Use   Vaping Use: Never used  Substance and Sexual Activity   Alcohol use: Yes  Alcohol/week: 1.0 standard drink of alcohol    Types: 1 Glasses of wine per week    Comment: 1-2 drinks per day   Drug use: No   Sexual activity: Yes    Birth control/protection: None  Other Topics Concern   Not on file  Social History Narrative   Not on file   Social Determinants of Health   Financial Resource Strain: Not on file  Food Insecurity: Not on file  Transportation Needs: Not on file  Physical Activity: Not on file  Stress: Not on file  Social Connections: Not on file     Family History: The patient's family history is not on file.  ROS:   Please see the history of present illness.     All other systems reviewed and are  negative.  EKGs/Labs/Other Studies Reviewed:    The following studies were reviewed today:   EKG:  EKG is  ordered today.  The ekg ordered today demonstrates sinus bradycardia, heart rate 49  Recent Labs: 07/16/2022: BUN 23; Creatinine, Ser 1.16; Hemoglobin 13.9; Magnesium 2.1; Platelets 181; Potassium 3.8; Sodium 139; TSH 5.351  Recent Lipid Panel No results found for: "CHOL", "TRIG", "HDL", "CHOLHDL", "VLDL", "LDLCALC", "LDLDIRECT"   Risk Assessment/Calculations:             Physical Exam:    VS:  BP 130/62 (BP Location: Right Arm, Patient Position: Sitting, Cuff Size: Normal)   Pulse (!) 49   Ht '5\' 11"'$  (1.803 m)   Wt 203 lb 3.2 oz (92.2 kg)   SpO2 96%   BMI 28.34 kg/m     Wt Readings from Last 3 Encounters:  08/21/22 203 lb 3.2 oz (92.2 kg)  04/29/22 195 lb (88.5 kg)  01/22/21 192 lb (87.1 kg)     GEN:  Well nourished, well developed in no acute distress HEENT: Normal NECK: No JVD; No carotid bruits CARDIAC: Bradycardic, regular RESPIRATORY:  Clear to auscultation without rales, wheezing or rhonchi  ABDOMEN: Soft, non-tender, non-distended MUSCULOSKELETAL:  No edema; No deformity  SKIN: Warm and dry NEUROLOGIC:  Alert and oriented x 3 PSYCHIATRIC:  Normal affect   ASSESSMENT:    1. Paroxysmal atrial fibrillation (HCC)   2. Dyspnea on exertion   3. Pure hypercholesterolemia    PLAN:    In order of problems listed above:  Paroxysmal atrial fibrillation, EKG showing sinus bradycardia heart rate 49.  Maintaining sinus rhythm/bradycardia with flecainide.  Stop Lopressor, continue Eliquis 5 mg twice daily.  Get echocardiogram, refer patient to A-fib clinic.  Continue flecainide. Dyspnea on exertion, fibrosis noted on previous chest CT.  Echo as above, obtain coronary CT. Hyperlipidemia, continue statin.  Follow-up after echo and coronary CTA.    Medication Adjustments/Labs and Tests Ordered: Current medicines are reviewed at length with the patient today.   Concerns regarding medicines are outlined above.  Orders Placed This Encounter  Procedures   CT CORONARY MORPH W/CTA COR W/SCORE W/CA W/CM &/OR WO/CM   Basic Metabolic Panel (BMET)   Ambulatory referral to Cardiac Electrophysiology   EKG 12-Lead   ECHOCARDIOGRAM COMPLETE   No orders of the defined types were placed in this encounter.   Patient Instructions  Medication Instructions:   STOP Lopressor  *If you need a refill on your cardiac medications before your next appointment, please call your pharmacy*   Lab Work:  Your physician recommends you go to the medical mall for lab work.   If you have labs (blood work) drawn today and your tests  are completely normal, you will receive your results only by: MyChart Message (if you have MyChart) OR A paper copy in the mail If you have any lab test that is abnormal or we need to change your treatment, we will call you to review the results.   Testing/Procedures:  Your physician has requested that you have an echocardiogram. Echocardiography is a painless test that uses sound waves to create images of your heart. It provides your doctor with information about the size and shape of your heart and how well your heart's chambers and valves are working. This procedure takes approximately one hour. There are no restrictions for this procedure. Please do NOT wear cologne, perfume, aftershave, or lotions (deodorant is allowed). Please arrive 15 minutes prior to your appointment time.    Your cardiac CT is scheduled February 22nd, 2024 @ [facility] [address] [place], [place] [address] [phone]  If scheduled at [facility] or [facility], please arrive 15 mins early for check-in and test prep.   Please follow these instructions carefully (unless otherwise directed):   On the Night Before the Test: Be sure to Drink plenty of  water. Do not consume any caffeinated/decaffeinated beverages or chocolate 12 hours prior to your test. Do not take any antihistamines 12 hours prior to your test.  On the Day of the Test: Drink plenty of water until 1 hour prior to the test. Do not eat any food 1 hour prior to test. You may take your regular medications prior to the test.       After the Test: Drink plenty of water. After receiving IV contrast, you may experience a mild flushed feeling. This is normal. On occasion, you may experience a mild rash up to 24 hours after the test. This is not dangerous. If this occurs, you can take Benadryl 25 mg and increase your fluid intake. If you experience trouble breathing, this can be serious. If it is severe call 911 IMMEDIATELY. If it is mild, please call our office.  For scheduling needs, including cancellations and rescheduling, please call [place], [phone].   Follow-Up: At [facility], you and your health needs are our priority.  As part of our continuing mission to provide you with exceptional heart care, we have created designated Provider Care Teams.  These Care Teams include your primary Cardiologist (physician) and Advanced Practice Providers (APPs -  Physician Assistants and Nurse Practitioners) who all work together to provide you with the care you need, when you need it.  We recommend signing up for the patient portal called "MyChart".  Sign up information is provided on this After Visit Summary.  MyChart is used to connect with patients for Virtual Visits (Telemedicine).  Patients are able to view lab/test results, encounter notes, upcoming appointments, etc.  Non-urgent messages can be sent to your provider as well.   To learn more about what you can do with MyChart, go to [email or web address].    Your next appointment:    After testing  Provider:   You may see Kate Sable, MD or one of the following Advanced Practice Providers on your  designated Care Team:   Murray Hodgkins, NP Christell Faith, PA-C Cadence Kathlen Mody, PA-C Gerrie Nordmann, NP   Signed, Kate Sable, MD  08/21/2022 12:31 PM    [place]

## 2022-08-21 NOTE — Patient Instructions (Signed)
Medication Instructions:   STOP Lopressor  *If you need a refill on your cardiac medications before your next appointment, please call your pharmacy*   Lab Work:  Your physician recommends you go to the medical mall for lab work.   If you have labs (blood work) drawn today and your tests are completely normal, you will receive your results only by: [place] (if you have MyChart) OR A paper copy in the mail If you have any lab test that is abnormal or we need to change your treatment, we will call you to review the results.   Testing/Procedures:  Your physician has requested that you have an echocardiogram. Echocardiography is a painless test that uses sound waves to create images of your heart. It provides your doctor with information about the size and shape of your heart and how well your heart's chambers and valves are working. This procedure takes approximately one hour. There are no restrictions for this procedure. Please do NOT wear cologne, perfume, aftershave, or lotions (deodorant is allowed). Please arrive 15 minutes prior to your appointment time.    Your cardiac CT is scheduled February 22nd, 2024 @ [facility] [address] [place], [place] [address] [phone]  If scheduled at [facility] or [facility], please arrive 15 mins early for check-in and test prep.   Please follow these instructions carefully (unless otherwise directed):   On the Night Before the Test: Be sure to Drink plenty of water. Do not consume any caffeinated/decaffeinated beverages or chocolate 12 hours prior to your test. Do not take any antihistamines 12 hours prior to your test.  On the Day of the Test: Drink plenty of water until 1 hour prior to the test. Do not eat any food 1 hour prior to test. You may take your regular medications prior to the test.       After the Test: Drink  plenty of water. After receiving IV contrast, you may experience a mild flushed feeling. This is normal. On occasion, you may experience a mild rash up to 24 hours after the test. This is not dangerous. If this occurs, you can take Benadryl 25 mg and increase your fluid intake. If you experience trouble breathing, this can be serious. If it is severe call 911 IMMEDIATELY. If it is mild, please call our office.  For scheduling needs, including cancellations and rescheduling, please call [place], [phone].   Follow-Up: At [facility], you and your health needs are our priority.  As part of our continuing mission to provide you with exceptional heart care, we have created designated Provider Care Teams.  These Care Teams include your primary Cardiologist (physician) and Advanced Practice Providers (APPs -  Physician Assistants and Nurse Practitioners) who all work together to provide you with the care you need, when you need it.  We recommend signing up for the patient portal called "MyChart".  Sign up information is provided on this After Visit Summary.  MyChart is used to connect with patients for Virtual Visits (Telemedicine).  Patients are able to view lab/test results, encounter notes, upcoming appointments, etc.  Non-urgent messages can be sent to your provider as well.   To learn more about what you can do with MyChart, go to [email or web address].    Your next appointment:    After testing  Provider:   You may see Kate Sable, MD or one of the following Advanced Practice Providers on your  designated Care Team:   Murray Hodgkins, NP Christell Faith, PA-C Cadence Kathlen Mody, PA-C Gerrie Nordmann, NP

## 2022-09-09 ENCOUNTER — Telehealth (HOSPITAL_COMMUNITY): Payer: Self-pay | Admitting: Emergency Medicine

## 2022-09-09 ENCOUNTER — Telehealth (HOSPITAL_COMMUNITY): Payer: Self-pay | Admitting: *Deleted

## 2022-09-09 NOTE — Telephone Encounter (Signed)
Attempted to call patient regarding upcoming cardiac CT appointment. °Left message on voicemail with name and callback number °Danetra Glock RN Navigator Cardiac Imaging °[facility] °[phone] Office °[phone] Cell ° °

## 2022-09-09 NOTE — Telephone Encounter (Signed)
Patient returning call about his upcoming cardiac imaging study; pt verbalizes understanding of appt date/time, parking situation and where to check in, pre-test NPO status, and verified current allergies; name and call back number provided for further questions should they arise  Gordy Clement RN Navigator Cardiac Imaging [facility] [phone] office [phone] cell  Patient to take his daily medications.

## 2022-09-10 ENCOUNTER — Ambulatory Visit
Admission: RE | Admit: 2022-09-10 | Discharge: 2022-09-10 | Disposition: A | Discharge status: Home / Self Care | Payer: Medicare HMO | Source: Ambulatory Visit | Attending: Cardiology | Admitting: Cardiology

## 2022-09-10 DIAGNOSIS — J449 Chronic obstructive pulmonary disease, unspecified: Secondary | ICD-10-CM | POA: Insufficient documentation

## 2022-09-10 DIAGNOSIS — I2510 Atherosclerotic heart disease of native coronary artery without angina pectoris: Secondary | ICD-10-CM | POA: Insufficient documentation

## 2022-09-10 DIAGNOSIS — R9431 Abnormal electrocardiogram [ECG] [EKG]: Secondary | ICD-10-CM | POA: Diagnosis not present

## 2022-09-10 DIAGNOSIS — R0609 Other forms of dyspnea: Secondary | ICD-10-CM | POA: Diagnosis present

## 2022-09-10 HISTORY — DX: Atherosclerotic heart disease of native coronary artery without angina pectoris: I25.10

## 2022-09-10 MED ORDER — METOPROLOL TARTRATE 5 MG/5ML IV SOLN
10.0000 mg | Freq: Once | INTRAVENOUS | Status: AC
  Administered 2022-09-10: 10 mg via INTRAVENOUS

## 2022-09-10 MED ORDER — NITROGLYCERIN 0.4 MG SL SUBL
0.8000 mg | SUBLINGUAL_TABLET | Freq: Once | SUBLINGUAL | Status: AC
  Administered 2022-09-10: 0.8 mg via SUBLINGUAL

## 2022-09-10 MED ORDER — IOHEXOL 350 MG/ML SOLN
75.0000 mL | Freq: Once | INTRAVENOUS | Status: AC | PRN
  Administered 2022-09-10: 75 mL via INTRAVENOUS

## 2022-09-10 NOTE — Progress Notes (Signed)
Patient tolerated CT well. Drank water after. Vital signs stable encourage to drink water throughout day.Reasons explained and verbalized understanding. Ambulated steady gait.  

## 2022-09-11 ENCOUNTER — Telehealth: Payer: Self-pay | Admitting: Cardiology

## 2022-09-11 NOTE — Telephone Encounter (Signed)
Spoke with patient and he stated that he has been experiencing episodes of tachycardia. Current HR is 124 BPM. Patient denies chest pain, dyspnea, nausea, vomiting, and syncope. Instructed patient to sit down and take slow deep breaths, try to relax and calm down and to avoid caffeine and alcohol. Please advise

## 2022-09-11 NOTE — Telephone Encounter (Signed)
STAT if HR is under 50 or over 120 (normal HR is 60-100 beats per minute)  What is your heart rate? Now 124  Do you have a log of your heart rate readings (document readings)? Last night ranged 110-120 all while asleep  Do you have any other symptoms? Tingling in left arm

## 2022-09-14 MED ORDER — METOPROLOL TARTRATE 25 MG PO TABS: 12.5000 mg | ORAL_TABLET | Freq: Two times a day (BID) | ORAL | 3 refills | Status: AC

## 2022-09-14 NOTE — Addendum Note (Signed)
Addended by: Janan Ridge on: 09/14/2022 10:13 AM   Modules accepted: Orders

## 2022-09-14 NOTE — Telephone Encounter (Signed)
Called patient to review Dr. Garen Lah recommendations.   Patient reports that he restarted Lopressor 12.'5mg'$  twice a day along with his flecainide and that has helped bring his HR down. Patient would like to keep this as his current regimen.   Updated medication list to refect this.

## 2022-09-23 ENCOUNTER — Institutional Professional Consult (permissible substitution): Payer: Medicare HMO | Admitting: Cardiology

## 2022-10-05 ENCOUNTER — Other Ambulatory Visit: Payer: PPO

## 2022-10-09 ENCOUNTER — Ambulatory Visit: Payer: PPO | Admitting: Physician Assistant

## 2022-12-24 ENCOUNTER — Ambulatory Visit: Payer: Medicare HMO | Admitting: Urology

## 2022-12-24 ENCOUNTER — Encounter: Payer: Self-pay | Admitting: Urology

## 2022-12-24 VITALS — BP 134/78 | HR 63 | Ht 71.0 in | Wt 198.0 lb

## 2022-12-24 DIAGNOSIS — R310 Gross hematuria: Secondary | ICD-10-CM

## 2022-12-24 DIAGNOSIS — N432 Other hydrocele: Secondary | ICD-10-CM

## 2022-12-24 NOTE — Patient Instructions (Signed)
Cystoscopy Cystoscopy is a procedure that is used to help diagnose and sometimes treat conditions that affect the lower urinary tract. The lower urinary tract includes the bladder and the urethra. The urethra is the tube that drains urine from the bladder. Cystoscopy is done using a thin, tube-shaped instrument with a light and camera at the end (cystoscope). The cystoscope may be hard or flexible, depending on the goal of the procedure. The cystoscope is inserted through the urethra, into the bladder. Cystoscopy may be recommended if you have: Urinary tract infections that keep coming back. Blood in the urine (hematuria). An inability to control when you urinate (urinary incontinence) or an overactive bladder. Unusual cells found in a urine sample. A blockage in the urethra, such as a urinary stone. Painful urination. An abnormality in the bladder found during an intravenous pyelogram (IVP) or CT scan. What are the risks? Generally, this is a safe procedure. However, problems may occur, including: Infection. Bleeding.  What happens during the procedure?  You will be given one or more of the following: A medicine to numb the area (local anesthetic). The area around the opening of your urethra will be cleaned. The cystoscope will be passed through your urethra into your bladder. Germ-free (sterile) fluid will flow through the cystoscope to fill your bladder. The fluid will stretch your bladder so that your health care provider can clearly examine your bladder walls. Your doctor will look at the urethra and bladder. The cystoscope will be removed The procedure may vary among health care providers  What can I expect after the procedure? After the procedure, it is common to have: Some soreness or pain in your urethra. Urinary symptoms. These include: Mild pain or burning when you urinate. Pain should stop within a few minutes after you urinate. This may last for up to a few days after the  procedure. A small amount of blood in your urine for several days. Feeling like you need to urinate but producing only a small amount of urine. Follow these instructions at home: General instructions Return to your normal activities as told by your health care provider.  Drink plenty of fluids after the procedure. Keep all follow-up visits as told by your health care provider. This is important. Contact a health care provider if you: Have pain that gets worse or does not get better with medicine, especially pain when you urinate lasting longer than 72 hours after the procedure. Have trouble urinating. Get help right away if you: Have blood clots in your urine. Have a fever or chills. Are unable to urinate. Summary Cystoscopy is a procedure that is used to help diagnose and sometimes treat conditions that affect the lower urinary tract. Cystoscopy is done using a thin, tube-shaped instrument with a light and camera at the end. After the procedure, it is common to have some soreness or pain in your urethra. It is normal to have blood in your urine after the procedure.  If you were prescribed an antibiotic medicine, take it as told by your health care provider.  This information is not intended to replace advice given to you by your health care provider. Make sure you discuss any questions you have with your health care provider. Document Revised: 06/28/2018 Document Reviewed: 06/28/2018 Elsevier Patient Education  2020 Else[employer]  Hydrocelectomy, Adult  A hydrocelectomy is a surgical procedure to remove a collection of fluid (hydrocele) from the scrotum. The scrotum is the pouch that holds the testicles. You may need to  have this procedure if a hydrocele is causing painful swelling in your scrotum. Tell a health care provider about: Any allergies you have. All medicines you are taking, including vitamins, herbs, eye drops, creams, and over-the-counter medicines. Any problems you or  family members have had with anesthetic medicines. Any bleeding problems you have. Any surgeries you have had. Any medical conditions you have. What are the risks? Generally, this is a safe procedure. However, problems may occur, including: Bleeding into the scrotum (scrotal hematoma). Damage to nearby structures or organs, including to the testicle or the tube that carries sperm out of the testicle (vas deferens). Infection. Allergic reactions to medicines. What happens before the procedure? When to stop eating and drinking Follow instructions from your health care provider about what you may eat and drink before your procedure. These may include: 8 hours before your procedure Stop eating most foods. Do not eat meat, fried foods, or fatty foods. Eat only light foods, such as toast or crackers. All liquids are okay except energy drinks and alcohol. 6 hours before your procedure Stop eating. Drink only clear liquids, such as water, clear fruit juice, black coffee, plain tea, and sports drinks. Do not drink energy drinks or alcohol. 2 hours before your procedure Stop drinking all liquids. You may be allowed to take medicines with small sips of water. If you do not follow your health care provider's instructions, your procedure may be delayed or canceled. Medicines Ask your health care provider about: Changing or stopping your regular medicines. This is especially important if you are taking diabetes medicines or blood thinners. Taking medicines such as aspirin and ibuprofen. These medicines can thin your blood. Do not take these medicines unless your health care provider tells you to take them. Taking over-the-counter medicines, vitamins, herbs, and supplements. Surgery safety Ask your health care provider: How your surgery site will be marked. What steps will be taken to help prevent infection. These steps may include: Removing hair at the surgery site. Washing skin with a  germ-killing soap. Taking antibiotic medicine. General instructions Do not use any products that contain nicotine or tobacco for at least 4 weeks before the procedure. These products include cigarettes, chewing tobacco, and vaping devices, such as e-cigarettes. If you need help quitting, ask your health care provider. If you will be going home right after the procedure, plan to have a responsible adult: Take you home from the hospital or clinic. You will not be allowed to drive. Care for you for the time you are told. What happens during the procedure? An IV will be inserted into one of your veins. You will be given one or both of the following: A medicine to make you relax (sedative). A medicine to make you fall asleep (general anesthetic). A small incision will be made through the skin of your scrotum. Your testicle and the hydrocele will be located, and the hydrocele sac will be opened with an incision. The fluid will be drained from the hydrocele. Part of the hydrocele sac may be removed. The hydrocele will be closed with stitches that dissolve (absorbable sutures). This prevents fluid from building up again. If your hydrocele is large, you may have a thin, rubber drain placed to allow fluid to drain after the procedure. The incision in your scrotum will be closed with absorbable sutures, skin glue, or adhesive strips. A bandage (dressing) will be placed over the incision. The dressing may be held in place with an athletic support strap (scrotal support). The  procedure may vary among health care providers and hospitals. What happens after the procedure?  Your blood pressure, heart rate, breathing rate, and blood oxygen level will be monitored until you leave the hospital or clinic. You will be given pain medicine as needed. Your IV will be removed, and your insertion site will be checked for bleeding. You may need to wear a scrotal support. This holds the dressing in place and supports  your scrotum. If you were given a sedative during the procedure, it can affect you for several hours. Do not drive or operate machinery until your health care provider says that it is safe. Summary A hydrocelectomy is a surgical procedure to remove a collection of fluid from the scrotum. You may need to have this procedure if a hydrocele is causing painful swelling in your scrotum. During the procedure, the hydrocele will be drained and then closed with stitches that dissolve (absorbable sutures). This prevents fluid from building up again. If your hydrocele is large, you may have a thin, rubber drain placed to allow fluid to drain after the procedure. You may need to wear a scrotal support after your procedure. This holds the dressing in place and supports your scrotum. This information is not intended to replace advice given to you by your health care provider. Make sure you discuss any questions you have with your health care provider. Document Revised: 02/20/2021 Document Reviewed: 02/20/2021 Elsevier Patient Education  2024 [employer].

## 2022-12-24 NOTE — Progress Notes (Signed)
12/24/22 10:18 AM   John Marks Konrad Felix 07-07-1941 [id number]  CC: History of prostate cancer, gross hematuria, bilateral hydroceles  HPI: 82 year old male with distant history of open prostatectomy with Dr. Achilles Dunk, PSA undetectable since that time who presents with 1 week of gross hematuria as well as chronic bilateral scrotal swelling and known hydroceles.  I actually saw him in 2020 and scrotal ultrasound showed bilateral hydroceles but he deferred any treatment at that time and did not follow-up.  His hydroceles have enlarged, specifically on the right side and have become increasingly bothersome.  He is at the point where he would consider surgery.  He also reports 1 week of painless gross hematuria.  He gave a urine sample to PCP in March 2024 that showed significant microscopic hematuria with >150 RBC.  Urinalysis today pending.   PMH: Past Medical History:  Diagnosis Date   Acid reflux    Arthritis    Atrial fibrillation (HCC)    DDD (degenerative disc disease), cervical    Hyperlipidemia    Hypertension    Prostate cancer [facility])     Surgical History: Past Surgical History:  Procedure Laterality Date   COLONOSCOPY WITH PROPOFOL N/A 08/01/2015   Procedure: COLONOSCOPY WITH PROPOFOL;  Surgeon: Scot Jun, MD;  Location: [facility] ENDOSCOPY;  Service: Endoscopy;  Laterality: N/A;   COLONOSCOPY WITH PROPOFOL N/A 01/22/2021   Procedure: COLONOSCOPY WITH PROPOFOL;  Surgeon: Toledo, Boykin Nearing, MD;  Location: [facility] ENDOSCOPY;  Service: Gastroenterology;  Laterality: N/A;   PERINEAL PROSTATECTOMY     pointer finger surgery Left 05/2019    Family History: No family history on file.  Social History:  reports that he quit smoking about 35 years ago. His smoking use included cigarettes. He has a 60.00 pack-year smoking history. He has been exposed to tobacco smoke. He has never used smokeless tobacco. He reports current alcohol use of about 1.0 standard drink of alcohol per week. He  reports that he does not use drugs.  Physical Exam: BP 134/78   Pulse 63   Ht 5\' 11"  (1.803 m)   Wt 198 lb (89.8 kg)   BMI 27.62 kg/m    Constitutional:  Alert and oriented, No acute distress. Cardiovascular: No clubbing, cyanosis, or edema. Respiratory: Normal respiratory effort, no increased work of breathing. GI: Abdomen is soft, nontender, nondistended, no abdominal masses   Laboratory Data: Reviewed, see HPI  Pertinent Imaging: I have personally viewed and interpreted the scrotal ultrasound from November 2020 showing bilateral hydroceles.  Assessment & Plan:   82 year old male with history of prostate cancer treated with open prostatectomy in the distant past by Dr. Achilles Dunk, PSA remains undetectable.  New gross hematuria x 1 week, documented microscopic hematuria from March 2024, as well as bilateral hydroceles, larger on the right, confirmed on ultrasound in 2020.  We discussed common possible etiologies of hematuria including BPH, malignancy, urolithiasis, medical renal disease, and idiopathic. Standard workup recommended by the [facility] includes imaging with CT urogram to assess the upper tracts, and cystoscopy. Cytology is performed on patient's with gross hematuria to look for malignant cells in the urine.  Regarding his hydroceles, pending hematuria workup above, could consider right versus bilateral hydrocelectomy if patient desires.  Risk and benefits discussed at length.  Will further evaluate for hernia with CT urogram above.  CT urogram and cystoscopy for completion of gross hematuria workup Consider bilateral hydrocelectomy pending above findings  Legrand Rams, MD 12/24/2022  [facility] [address]  [place], [place] [address] [phone]

## 2022-12-25 LAB — URINALYSIS, COMPLETE
Bilirubin, UA: NEGATIVE
Glucose, UA: NEGATIVE
Ketones, UA: NEGATIVE
Leukocytes,UA: NEGATIVE
Nitrite, UA: NEGATIVE
Protein,UA: NEGATIVE
Specific Gravity, UA: 1.02 (ref 1.005–1.030)
Urobilinogen, Ur: 1 mg/dL (ref 0.2–1.0)
pH, UA: 7.5 (ref 5.0–7.5)

## 2022-12-25 LAB — MICROSCOPIC EXAMINATION
Bacteria, UA: NONE SEEN
RBC, Urine: >30 /hpf — AB (ref 0–2)

## 2022-12-27 LAB — CULTURE, URINE COMPREHENSIVE

## 2023-01-05 ENCOUNTER — Ambulatory Visit
Admission: RE | Admit: 2023-01-05 | Discharge: 2023-01-05 | Disposition: A | Discharge status: Home / Self Care | Payer: Medicare HMO | Source: Ambulatory Visit | Attending: Urology | Admitting: Urology

## 2023-01-05 DIAGNOSIS — R310 Gross hematuria: Secondary | ICD-10-CM | POA: Diagnosis not present

## 2023-01-05 LAB — POCT I-STAT CREATININE: Creatinine, Ser: 1.3 mg/dL — ABNORMAL HIGH (ref 0.61–1.24)

## 2023-01-05 MED ORDER — IOHEXOL 300 MG/ML  SOLN
100.0000 mL | Freq: Once | INTRAMUSCULAR | Status: AC | PRN
  Administered 2023-01-05: 100 mL via INTRAVENOUS

## 2023-01-13 ENCOUNTER — Telehealth: Payer: Self-pay

## 2023-01-13 NOTE — Telephone Encounter (Signed)
-----   Message from Sondra Come, MD sent at 01/13/2023  8:39 AM EDT ----- The radiologist did see a kidney stone on his CT scan, this is not currently causing blockage but may be too large to pass and is in the ureter.  I would recommend canceling clinic cystoscopy and moving up clinic visit to discuss CT findings and  stone removal with simultaneous hydrocele repair  Legrand Rams, MD 01/13/2023

## 2023-01-13 NOTE — Telephone Encounter (Signed)
Called pt informed him of the information below. Pt voiced understanding, appointment moved to 01/26/23. Pt aware appt is at the [facility] location.

## 2023-01-26 ENCOUNTER — Ambulatory Visit: Payer: Medicare HMO | Admitting: Urology

## 2023-01-26 ENCOUNTER — Other Ambulatory Visit: Payer: Self-pay | Admitting: Urology

## 2023-01-26 ENCOUNTER — Other Ambulatory Visit
Admission: RE | Admit: 2023-01-26 | Discharge: 2023-01-26 | Disposition: A | Discharge status: Home / Self Care | Payer: Medicare HMO | Attending: Urology | Admitting: Urology

## 2023-01-26 ENCOUNTER — Encounter: Payer: Self-pay | Admitting: Urology

## 2023-01-26 VITALS — BP 141/83 | HR 64 | Ht 71.0 in | Wt 198.0 lb

## 2023-01-26 DIAGNOSIS — N200 Calculus of kidney: Secondary | ICD-10-CM

## 2023-01-26 DIAGNOSIS — N432 Other hydrocele: Secondary | ICD-10-CM

## 2023-01-26 DIAGNOSIS — N201 Calculus of ureter: Secondary | ICD-10-CM

## 2023-01-26 LAB — URINALYSIS, COMPLETE (UACMP) WITH MICROSCOPIC
Bilirubin Urine: NEGATIVE
Glucose, UA: NEGATIVE mg/dL
Ketones, ur: NEGATIVE mg/dL
Leukocytes,Ua: NEGATIVE
Nitrite: NEGATIVE
Protein, ur: NEGATIVE mg/dL
RBC / HPF: >50 RBC/hpf (ref 0–5)
Specific Gravity, Urine: 1.02 (ref 1.005–1.030)
pH: 7 (ref 5.0–8.0)

## 2023-01-26 NOTE — Patient Instructions (Signed)
Hydrocelectomy, Adult  A hydrocelectomy is a surgical procedure to remove a collection of fluid (hydrocele) from the scrotum. The scrotum is the pouch that holds the testicles. You may need to have this procedure if a hydrocele is causing painful swelling in your scrotum. Tell a health care provider about: Any allergies you have. All medicines you are taking, including vitamins, herbs, eye drops, creams, and over-the-counter medicines. Any problems you or family members have had with anesthetic medicines. Any bleeding problems you have. Any surgeries you have had. Any medical conditions you have. What are the risks? Generally, this is a safe procedure. However, problems may occur, including: Bleeding into the scrotum (scrotal hematoma). Damage to nearby structures or organs, including to the testicle or the tube that carries sperm out of the testicle (vas deferens). Infection. Allergic reactions to medicines. What happens before the procedure? When to stop eating and drinking Follow instructions from your health care provider about what you may eat and drink before your procedure. These may include: 8 hours before your procedure Stop eating most foods. Do not eat meat, fried foods, or fatty foods. Eat only light foods, such as toast or crackers. All liquids are okay except energy drinks and alcohol. 6 hours before your procedure Stop eating. Drink only clear liquids, such as water, clear fruit juice, black coffee, plain tea, and sports drinks. Do not drink energy drinks or alcohol. 2 hours before your procedure Stop drinking all liquids. You may be allowed to take medicines with small sips of water. If you do not follow your health care provider's instructions, your procedure may be delayed or canceled. Medicines Ask your health care provider about: Changing or stopping your regular medicines. This is especially important if you are taking diabetes medicines or blood  thinners. Taking medicines such as aspirin and ibuprofen. These medicines can thin your blood. Do not take these medicines unless your health care provider tells you to take them. Taking over-the-counter medicines, vitamins, herbs, and supplements. Surgery safety Ask your health care provider: How your surgery site will be marked. What steps will be taken to help prevent infection. These steps may include: Removing hair at the surgery site. Washing skin with a germ-killing soap. Taking antibiotic medicine. General instructions Do not use any products that contain nicotine or tobacco for at least 4 weeks before the procedure. These products include cigarettes, chewing tobacco, and vaping devices, such as e-cigarettes. If you need help quitting, ask your health care provider. If you will be going home right after the procedure, plan to have a responsible adult: Take you home from the hospital or clinic. You will not be allowed to drive. Care for you for the time you are told. What happens during the procedure? An IV will be inserted into one of your veins. You will be given one or both of the following: A medicine to make you relax (sedative). A medicine to make you fall asleep (general anesthetic). A small incision will be made through the skin of your scrotum. Your testicle and the hydrocele will be located, and the hydrocele sac will be opened with an incision. The fluid will be drained from the hydrocele. Part of the hydrocele sac may be removed. The hydrocele will be closed with stitches that dissolve (absorbable sutures). This prevents fluid from building up again. If your hydrocele is large, you may have a thin, rubber drain placed to allow fluid to drain after the procedure. The incision in your scrotum will be closed with  absorbable sutures, skin glue, or adhesive strips. A bandage (dressing) will be placed over the incision. The dressing may be held in place with an athletic support  strap (scrotal support). The procedure may vary among health care providers and hospitals. What happens after the procedure?  Your blood pressure, heart rate, breathing rate, and blood oxygen level will be monitored until you leave the hospital or clinic. You will be given pain medicine as needed. Your IV will be removed, and your insertion site will be checked for bleeding. You may need to wear a scrotal support. This holds the dressing in place and supports your scrotum. If you were given a sedative during the procedure, it can affect you for several hours. Do not drive or operate machinery until your health care provider says that it is safe. Summary A hydrocelectomy is a surgical procedure to remove a collection of fluid from the scrotum. You may need to have this procedure if a hydrocele is causing painful swelling in your scrotum. During the procedure, the hydrocele will be drained and then closed with stitches that dissolve (absorbable sutures). This prevents fluid from building up again. If your hydrocele is large, you may have a thin, rubber drain placed to allow fluid to drain after the procedure. You may need to wear a scrotal support after your procedure. This holds the dressing in place and supports your scrotum. This information is not intended to replace advice given to you by your health care provider. Make sure you discuss any questions you have with your health care provider. Document Revised: 02/20/2021 Document Reviewed: 02/20/2021 Elsevier Patient Education  2024 [employer]  Laser Therapy for Kidney Stones(ureteroscopy) Laser therapy for kidney stones is a procedure to break up rock-like masses that form inside the kidneys (kidney stones). It is done using a device that beams a strong light (laser) on the kidney stones. This breaks the stones up into small pieces. These small pieces may leave your body when you pee (urinate) or may be taken out during the procedure.  You  may need laser therapy if you have kidney stones that are painful or that are stopping you from being able to pee. Tell a health care provider about: Any allergies you have. All medicines you are taking, including vitamins, herbs, eye drops, creams, and over-the-counter medicines. Any problems you or family members have had with anesthesia. Any bleeding problems you have. Any surgeries you have had. Any medical conditions you have. Whether you are pregnant or may be pregnant. What are the risks? Your health care provider will talk with you about risks. These may include: Infection. Bleeding. Allergic reactions to medicines. Damage to: The part of your body that drains pee (urine) from the bladder (urethra). The bladder. The tube that connects the bladder to the kidneys (ureter). Urinary tract infection (UTI). Urethral stricture. This is when the urethra is narrowed by scarring. Trouble peeing. Blockage of the kidney. This may be caused by a piece of kidney stone. What happens before the procedure? When to stop eating and drinking Follow instructions from your provider about what you may eat and drink. These may include: 8 hours before the procedure Stop eating most foods. Do not eat meat, fried foods, or fatty foods. Eat only light foods, such as toast or crackers. All liquids are okay except energy drinks and alcohol. 6 hours before the procedure Stop eating. Drink only clear liquids, such as water, clear fruit juice, black coffee, plain tea, and sports drinks. Do  not drink energy drinks or alcohol. 2 hours before the procedure Stop drinking all liquids. You may be allowed to take medicines with small sips of water. If you do not follow your provider's instructions, your procedure may be delayed or canceled. Medicines Ask your provider about: Changing or stopping your regular medicines. These include any diabetes medicines or blood thinners you take. Taking medicines such as  aspirin and ibuprofen. These medicines can thin your blood. Do not take them unless your provider tells you to. Taking over-the-counter medicines, vitamins, herbs, and supplements. Tests You may have a physical exam before the procedure. You may also have tests done. These may include: Imaging tests. Blood or pee tests. Surgery safety Ask your provider: How your surgery site will be marked. What steps will be taken to help prevent infection. These steps may include: Removing hair at the surgery site. Washing skin with a soap that kills germs. Taking antibiotics. General instructions Do not use any products that contain nicotine or tobacco for at least 4 weeks before the procedure. These products include cigarettes, chewing tobacco, and vaping devices, such as e-cigarettes. If you need help quitting, ask your provider. If you will be going home right after the procedure, plan to have a responsible adult: Take you home from the hospital or clinic. You will not be allowed to drive. Care for you for the time you are told. What happens during the procedure?  An IV will be inserted into one of your veins. You will be given: A sedative. This helps you relax. Anesthesia. This keeps you from feeling pain. It will make you fall asleep for surgery. A tool with a camera on the end (ureteroscope) will be put into your urethra. It will be moved through your bladder to your kidney. It will send pictures to a screen in the operating room. This will show what parts of your kidney need to be treated. A tube will be put through the ureteroscope. It will be moved into your kidney. The laser device will be put into your kidney through the tube. The laser will be used to break up the kidney stones. A tool with a tiny wire basket may be put through the tube into your kidney. This can help remove the small pieces of the kidney stone. A small mesh tube (stent) may be placed to allow your kidney to drain. The  tube and ureteroscope will be taken out at the end of the surgery. The procedure may vary among providers and hospitals. What happens after the procedure? Your blood pressure, heart rate, breathing rate, and blood oxygen level will be monitored until you leave the hospital or clinic. If you had a stent placed, it may have a string that will be secured to your skin. This helps your provider remove the stent. You may be given a strainer to collect any stone pieces that you pass in your pee. Your provider may have these tested. This information is not intended to replace advice given to you by your health care provider. Make sure you discuss any questions you have with your health care provider. Document Revised: 03/06/2022 Document Reviewed: 03/06/2022 Elsevier Patient Education  2024 [employer].

## 2023-01-26 NOTE — Progress Notes (Signed)
Surgical Physician Order Form [facility] [place]  * Scheduling expectation : Next Available  *Length of Case: 1.5 hours  *Clearance needed: no  *Anticoagulation Instructions: Hold all anticoagulants  *Aspirin Instructions: Hold Aspirin  *Post-op visit Date/Instructions:   tbd  *Diagnosis: Right Ureteral Stone, bilateral hydroceles  *Procedure: right Ureteroscopy w/laser lithotripsy & stent placement (16109), bilateral hydrocelectomy   Additional orders: N/A  -Admit type: OUTpatient  -Anesthesia: General  -VTE Prophylaxis Standing Order SCD's       Other:   -Standing Lab Orders Per Anesthesia    Lab other: UA&Urine Culture send 7/9  -Standing Test orders EKG/Chest x-ray per Anesthesia       Test other:   - Medications:  Ancef 2gm IV  -Other orders:  N/A

## 2023-01-26 NOTE — Addendum Note (Signed)
Addended by: Debbe Bales C on: 01/26/2023 10:01 AM   Modules accepted: Orders

## 2023-01-26 NOTE — Progress Notes (Signed)
   01/26/2023 9:55 AM   [name] 1941-01-15 [id number]  Reason for visit: Follow up gross hematuria, new kidney stone, bilateral hydroceles, history of prostate cancer  HPI: 82 year old male with distant history of open prostatectomy for prostate cancer with Dr. Achilles Dunk, PSA remains undetectable who is here today for the above issues.  He underwent a CT urogram for gross hematuria which showed an 8 mm right proximal ureteral stone but no other abnormalities.  He does have some intermittent mild to moderate right-sided flank pain that has been going on at least a few months.  He also has large bilateral hydroceles that have become increasingly bothersome and he is interested in surgical treatment.  Was originally scheduled for cystoscopy today, but in the setting of ureteral stone, opted for cystoscopy at the time of ureteroscopy.  I think it is reasonable to address his right ureteral stone with ureteroscopy as well as the bilateral hydroceles with hydrocele repair simultaneously.  We specifically discussed the risks ureteroscopy including bleeding, infection/sepsis, stent related symptoms including flank pain/urgency/frequency/incontinence/dysuria, ureteral injury, inability to access stone, or need for staged or additional procedures.  We also reviewed potential of identifying additional pathology at time of cystoscopy that could necessitate bladder biopsy.  We also discussed the risk of hydrocelectomy including bleeding, infection, bruising and swelling for 6 to 8 weeks, 5% risk of recurrence, postoperative pain, need to hold anticoagulation.  Schedule right ureteroscopy, laser lithotripsy, stent placement and bilateral hydrocelectomy   Sondra Come, MD  [facility] [address], [address] [place], [place] [address] [phone]

## 2023-01-27 LAB — URINE CULTURE: Culture: NO GROWTH

## 2023-02-01 ENCOUNTER — Telehealth: Payer: Self-pay

## 2023-02-01 NOTE — Progress Notes (Signed)
   [facility] Surgical Posting Form  Surgery Date: Date: 02/26/2023  Surgeon: Dr. Legrand Rams, MD  Inpt ( No  )   Outpt (Yes)   Obs ( No  )   Diagnosis: N20.1 Right Ureteral Stone, N43.2 Bilateral Hydroceles  -CPT: [phone], [phone]  Surgery: Right Ureteroscopy with Laser Lithotripsy and Stent Placement, Bilateral Hydrocelectomy  Stop Anticoagulations: Yes and also hold ASA  Cardiac/Medical/Pulmonary Clearance needed: no  *Orders entered into EPIC  Date: 02/01/23   *Case booked in EPIC  Date: 01/26/23  *Notified pt of Surgery: Date: 01/26/2023  PRE-OP UA & CX: yes, obtained in clinic on 01/26/2023  *Placed into Prior Authorization Work Angela Nevin Date: 02/01/23  Assistant/laser/rep:No

## 2023-02-01 NOTE — Telephone Encounter (Signed)
I spoke with [name]. We have discussed possible surgery dates and Friday August 9th, 2024 was agreed upon by all parties. Patient given information about surgery date, what to expect pre-operatively and post operatively.  We discussed that a Pre-Admission Testing office will be calling to set up the pre-op visit that will take place prior to surgery, and that these appointments are typically done over the phone with a Pre-Admissions RN. Informed patient that our office will communicate any additional care to be provided after surgery. Patients questions or concerns were discussed during our call. Advised to call our office should there be any additional information, questions or concerns that arise. Patient verbalized understanding.

## 2023-02-04 ENCOUNTER — Other Ambulatory Visit: Payer: Medicare HMO | Admitting: Urology

## 2023-02-18 ENCOUNTER — Other Ambulatory Visit: Payer: Self-pay

## 2023-02-18 ENCOUNTER — Encounter: Payer: Self-pay | Admitting: Urology

## 2023-02-18 ENCOUNTER — Encounter
Admission: RE | Admit: 2023-02-18 | Discharge: 2023-02-18 | Disposition: A | Discharge status: Home / Self Care | Payer: Medicare HMO | Source: Ambulatory Visit | Attending: Urology | Admitting: Urology

## 2023-02-18 DIAGNOSIS — I10 Essential (primary) hypertension: Secondary | ICD-10-CM

## 2023-02-18 DIAGNOSIS — Z01812 Encounter for preprocedural laboratory examination: Secondary | ICD-10-CM

## 2023-02-18 HISTORY — DX: Tinnitus, unspecified ear: H93.19

## 2023-02-18 HISTORY — DX: Male erectile dysfunction, unspecified: N52.9

## 2023-02-18 HISTORY — DX: Bladder-neck obstruction: N32.0

## 2023-02-18 HISTORY — DX: Pneumonia, unspecified organism: J18.9

## 2023-02-18 HISTORY — DX: Personal history of urinary calculi: Z87.442

## 2023-02-18 HISTORY — DX: Diverticulosis of intestine, part unspecified, without perforation or abscess without bleeding: K57.90

## 2023-02-18 NOTE — Patient Instructions (Addendum)
Your procedure is scheduled on: 02/26/23 - Friday Report to the Registration Desk on the 1st floor of the [facility]. To find out your arrival time, please call [phone] between 1PM - 3PM on: 02/25/23 - Thursday If your arrival time is 6:00 am, do not arrive before that time as the [facility] entrance doors do not open until 6:00 am.  REMEMBER: Instructions that are not followed completely may result in serious medical risk, up to and including death; or upon the discretion of your surgeon and anesthesiologist your surgery may need to be rescheduled.  Do not eat food or drink any liquids after midnight the night before surgery.  No gum chewing or hard candies.  One week prior to surgery: Stop Anti-inflammatories (NSAIDS) such as meloxicam (MOBIC) , Advil, Aleve, Ibuprofen, Motrin, Naproxen, Naprosyn and Aspirin based products such as Excedrin, Goody's Powder, BC Powder.  Stop ANY OVER THE COUNTER supplements until after surgery: Magnesium , glucosamine-chondroitin , VITAMIN D-3 .  You may take Tylenol if needed for pain up until the day of surgery.   TAKE ONLY THESE MEDICATIONS THE MORNING OF SURGERY WITH A SIP OF WATER:  Choline Fenofibrate  flecainide (TAMBOCOR)  metoprolol tartrate (LOPRESSOR)    HOLD your ELIQUIS beginning 02/21/23.  No Alcohol for 24 hours before or after surgery.  No Smoking including e-cigarettes for 24 hours before surgery.  No chewable tobacco products for at least 6 hours before surgery.  No nicotine patches on the day of surgery.  Do not use any "recreational" drugs for at least a week (preferably 2 weeks) before your surgery.  Please be advised that the combination of cocaine and anesthesia may have negative outcomes, up to and including death. If you test positive for cocaine, your surgery will be cancelled.  On the morning of surgery brush your teeth with toothpaste and water, you may rinse your mouth with mouthwash if you wish. Do not  swallow any toothpaste or mouthwash.  Use CHG Soap or wipes as directed on instruction sheet.  Do not wear jewelry, make-up, hairpins, clips or nail polish.  Do not wear lotions, powders, or perfumes.   Do not shave body hair from the neck down 48 hours before surgery.  Contact lenses, hearing aids and dentures may not be worn into surgery.  Do not bring valuables to the hospital. [facility] is not responsible for any missing/lost belongings or valuables.   Notify your doctor if there is any change in your medical condition (cold, fever, infection).  Wear comfortable clothing (specific to your surgery type) to the hospital.  After surgery, you can help prevent lung complications by doing breathing exercises.  Take deep breaths and cough every 1-2 hours. Your doctor may order a device called an Incentive Spirometer to help you take deep breaths. When coughing or sneezing, hold a pillow firmly against your incision with both hands. This is called "splinting." Doing this helps protect your incision. It also decreases belly discomfort.  If you are being admitted to the hospital overnight, leave your suitcase in the car. After surgery it may be brought to your room.  In case of increased patient census, it may be necessary for you, the patient, to continue your postoperative care in the Same Day Surgery department.  If you are being discharged the day of surgery, you will not be allowed to drive home. You will need a responsible individual to drive you home and stay with you for 24 hours after surgery.  If you are taking public transportation, you will need to have a responsible individual with you.  Please call the Pre-admissions Testing Dept. at [phone] if you have any questions about these instructions.  Surgery Visitation Policy:  Patients having surgery or a procedure may have two visitors.  Children under the age of 75 must have an adult with them who is not the  patient.  Inpatient Visitation:    Visiting hours are 7 a.m. to 8 p.m. Up to four visitors are allowed at one time in a patient room. The visitors may rotate out with other people during the day.  One visitor age 60 or older may stay with the patient overnight and must be in the room by 8 p.m.    Preparing for Surgery with CHLORHEXIDINE GLUCONATE (CHG) Soap  Chlorhexidine Gluconate (CHG) Soap  o An antiseptic cleaner that kills germs and bonds with the skin to continue killing germs even after washing  o Used for showering the night before surgery and morning of surgery  Before surgery, you can play an important role by reducing the number of germs on your skin.  CHG (Chlorhexidine gluconate) soap is an antiseptic cleanser which kills germs and bonds with the skin to continue killing germs even after washing.  Please do not use if you have an allergy to CHG or antibacterial soaps. If your skin becomes reddened/irritated stop using the CHG.  1. Shower the NIGHT BEFORE SURGERY and the MORNING OF SURGERY with CHG soap.  2. If you choose to wash your hair, wash your hair first as usual with your normal shampoo.  3. After shampooing, rinse your hair and body thoroughly to remove the shampoo.  4. Use CHG as you would any other liquid soap. You can apply CHG directly to the skin and wash gently with a scrungie or a clean washcloth.  5. Apply the CHG soap to your body only from the neck down. Do not use on open wounds or open sores. Avoid contact with your eyes, ears, mouth, and genitals (private parts). Wash face and genitals (private parts) with your normal soap.  6. Wash thoroughly, paying special attention to the area where your surgery will be performed.  7. Thoroughly rinse your body with warm water.  8. Do not shower/wash with your normal soap after using and rinsing off the CHG soap.  9. Pat yourself dry with a clean towel.  10. Wear clean pajamas to bed the night before  surgery.  12. Place clean sheets on your bed the night of your first shower and do not sleep with pets.  13. Shower again with the CHG soap on the day of surgery prior to arriving at the hospital.  14. Do not apply any deodorants/lotions/powders.  15. Please wear clean clothes to the hospital.

## 2023-02-18 NOTE — Progress Notes (Signed)
Perioperative / Anesthesia Services  Pre-Admission Testing Clinical Review / Preoperative Anesthesia Consult  Date: 02/24/23  Patient Demographics:  Name: John Marks DOB:   Nov 30, 1940 MRN:   [id number]  Planned Surgical Procedure(s):    Case: [id number] Date/Time: 02/26/23 1431   Procedures:      CYSTOSCOPY/URETEROSCOPY/HOLMIUM LASER/STENT PLACEMENT (Right)     HYDROCELECTOMY ADULT (Bilateral)   Anesthesia type: General   Pre-op diagnosis: Rigth Ureteral Stone, Bilateral Hydroceles   Location: [facility] OR ROOM 10 / [facility]   Surgeons: Sondra Come, MD     NOTE: Available PAT nursing documentation and vital signs have been reviewed. Clinical nursing staff has updated patient's PMH/PSHx, current medication list, and drug allergies/intolerances to ensure comprehensive history available to assist in medical decision making as it pertains to the aforementioned surgical procedure and anticipated anesthetic course. Extensive review of available clinical information personally performed. John Marks PMH and PSHx updated with any diagnoses/procedures that  may have been inadvertently omitted during his intake with the pre-admission testing department's nursing staff.  Clinical Discussion:  John Marks is a 82 y.o. male who is submitted for pre-surgical anesthesia review and clearance prior to him undergoing the above procedure. Patient is a Former Smoker (60 pack years; quit 07/1987). Pertinent PMH includes: CAD, atrial fibrillation, aortic stenosis, diastolic dysfunction, ascending aorta dilatation, aortic atherosclerosis, LBBB, HTN, HLD, pulmonary fibrosis, OA, cervical DDD, lumbar stenosis (s/p laminectomy), prostate cancer (s/p prostatectomy), nephrolithiasis, BILATERAL hydrocele.  Patient is followed by cardiology John Pares, MD). He was last seen in the cardiology clinic on 09/22/2022; notes reviewed. At the time of his clinic visit, patient doing well overall from a  cardiovascular perspective.  Patient was experiencing intermittent episodes of palpitations related to his underlying atrial fibrillation diagnosis. Patient denied any chest pain, shortness of breath, PND, orthopnea, significant peripheral edema, weakness, fatigue, vertiginous symptoms, or presyncope/syncope. Patient with a past medical history significant for cardiovascular diagnoses. Documented physical exam was grossly benign, providing no evidence of acute exacerbation and/or decompensation of the patient's known cardiovascular conditions.  Most recent TTE was performed on 03/23/2019 revealing a normal left ventricular systolic function with an EF of >55%.  There were no regional wall motion abnormalities.  There was mild LVH. Left ventricular diastolic Doppler parameters consistent with abnormal relaxation (G1DD).  Right ventricular size and function normal.  There was trivial pan valvular regurgitation.  All transvalvular gradients were noted to be normal providing no evidence suggestive of valvular stenosis.  There was no pericardial effusion.  Aorta normal in size with no evidence of aneurysmal dilatation.  Most recent stress echocardiogram was performed on 03/15/2020 revealing a normal left ventricular systolic function with an EF of 55%.  There were no regional wall motion abnormalities.  Patient demonstrated a hypertensive response to exercise with a maximum blood pressure of 190/90 mmHg following 5 minutes and 51 seconds of exercise on a Bruce protocol.  Heart rate 110 bpm (MPHR 141 bpm). Patient able to achieve a maximum workload of 7 METS of physical activity.  There was trivial pan valvular regurgitation noted.  Mild aortic valve stenosis with a mean transvalvular pressure gradient of 10.8 mmHg; AVA (VTI) = 1.3 cm.  Cardiac CTA was performed on 09/10/2022 revealing an elevated coronary calcium score of 39.7.  This placed patient in the 11th percentile for age/sex/race matched control.  Study  suggestive of a minimal (<25%) stenosis in the proximal LAD.  Patient with normal coronary origin and RIGHT-sided dominance.  Patient  with an atrial fibrillation diagnosis; CHA2DS2-VASc Score = 4 (age x 2, HTN, vascular disease history). His rate and rhythm are currently being maintained on oral flecainide + metoprolol tartrate. He is chronically anticoagulated using dose reduced apixaban; reported to be compliant with therapy with no evidence or reports of GI bleeding.  Blood pressure reasonably controlled at 132/74 mmHg on currently prescribed beta-blocker monotherapy. He is on atorvastatin + choline fenofibrate therapy for his HLD diagnosis and further ASCVD prevention.  He is not diabetic. Patient does not have an OSAH diagnosis.  Patient makes efforts to remain active.  He exercises 3-4 times a week.  He is able to complete all of his ADLs/IADLs without cardiovascular limitation.  Per the [facility], patient able to achieve >4 METS of physical activity without experiencing any significant degree of angina/anginal equivalent symptoms.  No changes were made to his medication regimen.  Patient to follow-up with outpatient cardiology in 1 year or sooner if needed.  John Marks is scheduled for an CYSTOSCOPY/URETEROSCOPY/HOLMIUM LASER/STENT PLACEMENT (Right); HYDROCELECTOMY (Bilateral) on 02/26/2023 with Dr. Legrand Rams, MD.  Given patient's past medical history significant for cardiovascular diagnoses, presurgical cardiac clearance was sought by the PAT team. Per cardiology, "this patient is optimized for surgery and may proceed with the planned procedural course with a LOW risk of significant perioperative cardiovascular complications".   Again, this patient is on daily oral anticoagulation therapy using a DOAC medication. He has been instructed on recommendations for holding his apixaban for 5 days prior to his procedure with plans to restart as soon as postoperative bleeding risk felt to be minimized by his  attending surgeon. The patient has been instructed that his last dose of his apixaban should be on 02/20/2023.  Patient denies previous perioperative complications with anesthesia in the past. In review of the available records, it is noted that patient underwent a general anesthetic course here at [facility] ([facility]) in 01/2021 without documented complications.      01/26/2023    9:29 AM 12/24/2022    9:38 AM 09/10/2022    8:30 AM  Vitals with BMI  Height 5\' 11"  5\' 11"    Weight 198 lbs 198 lbs   BMI 27.63 27.63   Systolic 141 134 664  Diastolic 83 78 88  Pulse 64 63 58    Providers/Specialists:   NOTE: Primary physician provider listed below. Patient may have been seen by APP or partner within same practice.   PROVIDER ROLE / SPECIALTY LAST Lise Auer, MD Urology (Surgeon) 01/26/2023  Marguarite Arbour, MD Primary Care Provider 09/28/2022  Rudean Hitt, MD Cardiology 09/22/2022   Allergies:  Latex  Current Home Medications:   No current facility-administered medications for this encounter.    atorvastatin (LIPITOR) 40 MG tablet   calcium carbonate (TUMS EX) 750 MG chewable tablet   Cholecalciferol (VITAMIN D-3) 25 MCG (1000 UT) CAPS   Choline Fenofibrate 135 MG capsule   cyanocobalamin (VITAMIN B12) 100 MCG tablet   ELIQUIS 2.5 MG TABS tablet   flecainide (TAMBOCOR) 50 MG tablet   glucosamine-chondroitin 500-400 MG tablet   Magnesium 250 MG TABS   meloxicam (MOBIC) 7.5 MG tablet   metoprolol tartrate (LOPRESSOR) 25 MG tablet   History:   Past Medical History:  Diagnosis Date   Acid reflux    Aortic atherosclerosis (HCC)    Aortic stenosis 03/15/2020   a.) stress TTE 03/15/2020: mild (MPG 10.8 mmHg; AVA = 1.3cm2)   Arthritis  Ascending aorta dilatation (HCC) 04/16/2020   a.) CT chest 04/16/2020: asc thoracic Ao measured 39 mm   Atrial fibrillation (HCC)    a.) CHA2DS2VASc = 4 (age x2, HTN, vascular disease  history);  b.) rate/rhythm maintained on oral flecainide + metoprolol tartrate; chronically anticoagulated with dose reduced apixaban   Bladder outlet obstruction    CAD (coronary artery disease) 09/10/2022   a.) cCTA 09/10/2022: Ca2+ 39.7 (11th %ile for age/sex/race matched control); < 25% pLAD   Cholelithiasis with choledocholithiasis    Colon polyps    DDD (degenerative disc disease), cervical    Diastolic dysfunction 02/25/2017   a.) TTE 02/25/2017: EF >55%, mild LVH, mild BAE, mild MAC, triv AR/MR/PR, mild TR, G1DD; b.) stress TTE 08/30/2017: EF >55%, triv panval regurg, AoV sclerosis; c.) TTE 03/23/2019: EF >55%, mild LVH, triv panval regurg, G1DD; d.) stress TTE 03/15/2020: EF >55%, triv panval regurg   Diverticulosis    ED (erectile dysfunction) of organic origin    Hydrocele, bilateral    Hyperlipidemia    Hypertension    LBBB (left bundle branch block)    Long term current use of anticoagulant    a.) apixaban   Lumbar stenosis    a.) s/p lumbar laminectomy in 2008   Nephrolithiasis    Pneumonia    Prostate cancer (HCC)    a.) s/p retropubic prostatectomy 10/2005   Pulmonary fibrosis (HCC)    Tinnitus    Past Surgical History:  Procedure Laterality Date   BACK SURGERY     laminectomy lumbar   COLONOSCOPY WITH PROPOFOL N/A 08/01/2015   Procedure: COLONOSCOPY WITH PROPOFOL;  Surgeon: Scot Jun, MD;  Location: [facility] ENDOSCOPY;  Service: Endoscopy;  Laterality: N/A;   COLONOSCOPY WITH PROPOFOL N/A 01/22/2021   Procedure: COLONOSCOPY WITH PROPOFOL;  Surgeon: Toledo, Boykin Nearing, MD;  Location: [facility] ENDOSCOPY;  Service: Gastroenterology;  Laterality: N/A;   PERINEAL PROSTATECTOMY     pointer finger surgery Left 05/2019   POLYPECTOMY     TONSILLECTOMY     No family history on file. Social History   Tobacco Use   Smoking status: Former    Current packs/day: 0.00    Average packs/day: 2.0 packs/day for 30.0 years (60.0 ttl pk-yrs)    Types: Cigarettes    Start date:  07/20/1957    Quit date: 07/21/1987    Years since quitting: 35.6    Passive exposure: Past   Smokeless tobacco: Never  Vaping Use   Vaping status: Never Used  Substance Use Topics   Alcohol use: Yes    Alcohol/week: 1.0 standard drink of alcohol    Types: 1 Glasses of wine per week    Comment: 1-2 drinks per day   Drug use: No    Pertinent Clinical Results:  LABS:  Lab Results  Component Value Date   WBC 5.6 02/19/2023   HGB 14.2 02/19/2023   HCT 42.1 02/19/2023   MCV 88.3 02/19/2023   PLT 201 02/19/2023   Lab Results  Component Value Date   NA 138 02/19/2023   K 4.2 02/19/2023   CO2 21 (L) 02/19/2023   GLUCOSE 102 (H) 02/19/2023   BUN 21 02/19/2023   CREATININE 1.14 02/19/2023   CALCIUM 9.4 02/19/2023   GFRNONAA >60 02/19/2023   No visits with results within 3 Day(s) from this visit.  Latest known visit with results is:  Hospital Outpatient Visit on 01/26/2023  Component Date Value Ref Range Status   Specimen Description 01/26/2023    Final  Value:URINE, CLEAN CATCH Performed at [facility], [address] [place], [place] [address]    Special Requests 01/26/2023    Final                   Value:NONE Performed at [facility], [address], [place], [place] [address]    Culture 01/26/2023    Final                   Value:NO GROWTH Performed at [facility], [address] [place], [place] [address]    Report Status 01/26/2023 01/27/2023 FINAL   Final   Color, Urine 01/26/2023 YELLOW  YELLOW Final   APPearance 01/26/2023 HAZY (A)  CLEAR Final   Specific Gravity, Urine 01/26/2023 1.020  1.005 - 1.030 Final   pH 01/26/2023 7.0  5.0 - 8.0 Final   Glucose, UA 01/26/2023 NEGATIVE  NEGATIVE mg/dL Final   Hgb urine dipstick 01/26/2023 LARGE (A)  NEGATIVE Final   Bilirubin Urine 01/26/2023 NEGATIVE  NEGATIVE Final   Ketones, ur 01/26/2023 NEGATIVE  NEGATIVE mg/dL Final   Protein, ur 91/47/8295 NEGATIVE  NEGATIVE  mg/dL Final   Nitrite 62/13/0865 NEGATIVE  NEGATIVE Final   Leukocytes,Ua 01/26/2023 NEGATIVE  NEGATIVE Final   Squamous Epithelial / HPF 01/26/2023 0-5  0 - 5 /HPF Final   WBC, UA 01/26/2023 0-5  0 - 5 WBC/hpf Final   RBC / HPF 01/26/2023 >50  0 - 5 RBC/hpf Final   Bacteria, UA 01/26/2023 FEW (A)  NONE SEEN Final   Performed at [facility], [address] [place], [place] [address]    ECG: Date: 02/19/2023 Time ECG obtained: 0822 AM Rate: 53 bpm Rhythm: sinus bradycardia Axis (leads I and aVF): Left axis deviation Intervals: PR 204 ms. QRS 138 ms. QTc 401 ms. ST segment and T wave changes: No evidence of acute ST segment elevation or depression. Comparison: Similar to previous tracing obtained on 08/21/2022   IMAGING / PROCEDURES: CT HEMATURIA WORKUP performed on 01/05/2023 3 x 5 x 8 mm stone in the mid right ureter without significant hydronephrosis. Additional bilateral nonobstructing nephrolithiasis. Cholelithiasis with choledocholithiasis. No biliary ductal dilatation. Chronic interstitial thickening with subpleural reticulation in both lower lungs compatible with underlying pulmonary fibrosis Large bilateral hydrocele. Aortic atherosclerosis   CT CORONARY MORPH W/CTA COR W/SCORE W/CA W/CM &/OR WO/CM performed on 09/10/2022 Progressive atelectasis/scarring in both lungs. Moderate bronchitic changes, most pronounced in the right lower lobe. Changes of COPD. Coronary calcium score of 39.7. This was 11th percentile for age and sex matched control. Normal coronary origin with right dominance. Minimal proximal LAD stenosis (<25%). CAD-RADS 1. Minimal non-obstructive CAD (0-24%). Consider non-atherosclerotic causes of dyspnea. Consider preventive therapy and risk factor modification.  STRESS TRANSTHORACIC ECHOCARDIOGRAM performed on 03/15/2020 Stress duration 5 minutes and 51 seconds Maximum heart rate achieved 110 bpm (MPHR 141 bpm) Maximum workload 7  METS Normal left ventricular systolic function with an EF of >55% No regional wall motion abnormalities Trivial pan valvular regurgitation Mild aortic valve stenosis with an mean pressure gradient of 10.8 mmHg; AVA (VTI) 1.3 cm   Impression and Plan:  John Marks has been referred for pre-anesthesia review and clearance prior to him undergoing the planned anesthetic and procedural courses. Available labs, pertinent testing, and imaging results were personally reviewed by me in preparation for upcoming operative/procedural course. [facility] medical record has been updated following extensive record review and patient interview with PAT staff.  This patient has been appropriately cleared by cardiology with an overall LOW risk of experiencing significant perioperative cardiovascular complications. Based on clinical review performed today (02/24/23), barring any significant acute changes in the patient's overall condition, it is anticipated that he will be able to proceed with the planned surgical intervention. Any acute changes in clinical condition may necessitate his procedure being postponed and/or cancelled. Patient will meet with anesthesia team (MD and/or CRNA) on the day of his procedure for preoperative evaluation/assessment. Questions regarding anesthetic course will be fielded at that time.   Pre-surgical instructions were reviewed with the patient during his PAT appointment, and questions were fielded to satisfaction by PAT clinical staff. He has been instructed on which medications that he will need to hold prior to surgery, as well as the ones that have been deemed safe/appropriate to take on the day of his procedure. As part of the general education provided by PAT, patient made aware both verbally and in writing, that he would need to abstain from the use of any illegal substances during his perioperative course.  He was advised that failure to follow the provided instructions could  necessitate case cancellation or result in serious perioperative complications up to and including death. Patient encouraged to contact PAT and/or his surgeon's office to discuss any questions or concerns that may arise prior to surgery; verbalized understanding.   Quentin Mulling, MSN, APRN, FNP-C, CEN [facility]  Peri-operative Services Nurse Practitioner Phone: [phone] Fax: [phone] 02/24/23 9:59 AM  NOTE: This note has been prepared using Dragon dictation software. Despite my best ability to proofread, there is always the potential that unintentional transcriptional errors may still occur from this process.

## 2023-02-19 ENCOUNTER — Encounter
Admission: RE | Admit: 2023-02-19 | Discharge: 2023-02-19 | Disposition: A | Discharge status: Home / Self Care | Payer: Medicare HMO | Source: Ambulatory Visit | Attending: Urology | Admitting: Urology

## 2023-02-19 ENCOUNTER — Encounter: Payer: Self-pay | Admitting: Urology

## 2023-02-19 DIAGNOSIS — R001 Bradycardia, unspecified: Secondary | ICD-10-CM | POA: Diagnosis not present

## 2023-02-19 DIAGNOSIS — I10 Essential (primary) hypertension: Secondary | ICD-10-CM | POA: Insufficient documentation

## 2023-02-19 DIAGNOSIS — Z01812 Encounter for preprocedural laboratory examination: Secondary | ICD-10-CM

## 2023-02-19 DIAGNOSIS — Z01818 Encounter for other preprocedural examination: Secondary | ICD-10-CM | POA: Insufficient documentation

## 2023-02-19 LAB — BASIC METABOLIC PANEL
Anion gap: 9 (ref 5–15)
BUN: 21 mg/dL (ref 8–23)
CO2: 21 mmol/L — ABNORMAL LOW (ref 22–32)
Calcium: 9.4 mg/dL (ref 8.9–10.3)
Chloride: 108 mmol/L (ref 98–111)
Creatinine, Ser: 1.14 mg/dL (ref 0.61–1.24)
GFR, Estimated: >60 mL/min (ref >60)
Glucose, Bld: 102 mg/dL — ABNORMAL HIGH (ref 70–99)
Potassium: 4.2 mmol/L (ref 3.5–5.1)
Sodium: 138 mmol/L (ref 135–145)

## 2023-02-19 LAB — CBC
HCT: 42.1 % (ref 39.0–52.0)
Hemoglobin: 14.2 g/dL (ref 13.0–17.0)
MCH: 29.8 pg (ref 26.0–34.0)
MCHC: 33.7 g/dL (ref 30.0–36.0)
MCV: 88.3 fL (ref 80.0–100.0)
Platelets: 201 10*3/uL (ref 150–400)
RBC: 4.77 MIL/uL (ref 4.22–5.81)
RDW: 13.1 % (ref 11.5–15.5)
WBC: 5.6 10*3/uL (ref 4.0–10.5)
nRBC: 0 % (ref 0.0–0.2)

## 2023-02-25 ENCOUNTER — Other Ambulatory Visit: Payer: Medicare HMO | Admitting: Urology

## 2023-02-26 ENCOUNTER — Ambulatory Visit
Admission: RE | Admit: 2023-02-26 | Discharge: 2023-02-26 | Disposition: A | Discharge status: Home / Self Care | Payer: Medicare HMO | Attending: Urology | Admitting: Urology

## 2023-02-26 ENCOUNTER — Ambulatory Visit: Payer: Medicare HMO | Admitting: Urgent Care

## 2023-02-26 ENCOUNTER — Ambulatory Visit: Payer: Medicare HMO

## 2023-02-26 ENCOUNTER — Encounter
Admission: RE | Disposition: A | Discharge status: Home / Self Care | Payer: Self-pay | Source: Home / Self Care | Attending: Urology

## 2023-02-26 ENCOUNTER — Other Ambulatory Visit: Payer: Self-pay

## 2023-02-26 ENCOUNTER — Encounter: Payer: Self-pay | Admitting: Urology

## 2023-02-26 DIAGNOSIS — N201 Calculus of ureter: Secondary | ICD-10-CM | POA: Insufficient documentation

## 2023-02-26 DIAGNOSIS — E785 Hyperlipidemia, unspecified: Secondary | ICD-10-CM | POA: Insufficient documentation

## 2023-02-26 DIAGNOSIS — Z7901 Long term (current) use of anticoagulants: Secondary | ICD-10-CM | POA: Diagnosis not present

## 2023-02-26 DIAGNOSIS — R310 Gross hematuria: Secondary | ICD-10-CM | POA: Diagnosis not present

## 2023-02-26 DIAGNOSIS — Z87891 Personal history of nicotine dependence: Secondary | ICD-10-CM | POA: Diagnosis not present

## 2023-02-26 DIAGNOSIS — I10 Essential (primary) hypertension: Secondary | ICD-10-CM | POA: Insufficient documentation

## 2023-02-26 DIAGNOSIS — I4891 Unspecified atrial fibrillation: Secondary | ICD-10-CM | POA: Insufficient documentation

## 2023-02-26 DIAGNOSIS — Z9079 Acquired absence of other genital organ(s): Secondary | ICD-10-CM | POA: Diagnosis not present

## 2023-02-26 DIAGNOSIS — N433 Hydrocele, unspecified: Secondary | ICD-10-CM | POA: Insufficient documentation

## 2023-02-26 DIAGNOSIS — N432 Other hydrocele: Secondary | ICD-10-CM

## 2023-02-26 DIAGNOSIS — Z8546 Personal history of malignant neoplasm of prostate: Secondary | ICD-10-CM | POA: Insufficient documentation

## 2023-02-26 HISTORY — DX: Calculus of kidney: N20.0

## 2023-02-26 HISTORY — DX: Polyp of colon: K63.5

## 2023-02-26 HISTORY — DX: Hydrocele, unspecified: N43.3

## 2023-02-26 HISTORY — DX: Spinal stenosis, lumbar region without neurogenic claudication: M48.061

## 2023-02-26 HISTORY — DX: Pulmonary fibrosis, unspecified: J84.10

## 2023-02-26 HISTORY — PX: HYDROCELE EXCISION: SHX482

## 2023-02-26 HISTORY — DX: Left bundle-branch block, unspecified: I44.7

## 2023-02-26 HISTORY — DX: Atherosclerosis of aorta: I70.0

## 2023-02-26 HISTORY — PX: CYSTOSCOPY/URETEROSCOPY/HOLMIUM LASER/STENT PLACEMENT: SHX6546

## 2023-02-26 HISTORY — DX: Calculus of gallbladder and bile duct without cholecystitis without obstruction: K80.70

## 2023-02-26 HISTORY — DX: Long term (current) use of anticoagulants: Z79.01

## 2023-02-26 SURGERY — CYSTOSCOPY/URETEROSCOPY/HOLMIUM LASER/STENT PLACEMENT
Anesthesia: General | Site: Ureter | Laterality: Right

## 2023-02-26 MED ORDER — CHLORHEXIDINE GLUCONATE 0.12 % MT SOLN
15.0000 mL | Freq: Once | OROMUCOSAL | Status: AC
  Administered 2023-02-26: 15 mL via OROMUCOSAL

## 2023-02-26 MED ORDER — SUGAMMADEX SODIUM 500 MG/5ML IV SOLN
INTRAVENOUS | Status: DC | PRN
  Administered 2023-02-26: 200 mg via INTRAVENOUS

## 2023-02-26 MED ORDER — FENTANYL CITRATE (PF) 100 MCG/2ML IJ SOLN
INTRAMUSCULAR | Status: AC
  Filled 2023-02-26: qty 2

## 2023-02-26 MED ORDER — DEXAMETHASONE SODIUM PHOSPHATE 10 MG/ML IJ SOLN
INTRAMUSCULAR | Status: AC
  Filled 2023-02-26: qty 1

## 2023-02-26 MED ORDER — DEXAMETHASONE SODIUM PHOSPHATE 10 MG/ML IJ SOLN
INTRAMUSCULAR | Status: DC | PRN
  Administered 2023-02-26: 5 mg via INTRAVENOUS

## 2023-02-26 MED ORDER — OXYCODONE-ACETAMINOPHEN 5-325 MG PO TABS: 1.0000 | ORAL_TABLET | Freq: Four times a day (QID) | ORAL | 0 refills | Status: AC | PRN

## 2023-02-26 MED ORDER — LIDOCAINE HCL (PF) 1 % IJ SOLN
INTRAMUSCULAR | Status: AC
  Filled 2023-02-26: qty 30

## 2023-02-26 MED ORDER — FENTANYL CITRATE (PF) 100 MCG/2ML IJ SOLN
INTRAMUSCULAR | Status: DC | PRN
  Administered 2023-02-26: 25 ug via INTRAVENOUS
  Administered 2023-02-26: 50 ug via INTRAVENOUS
  Administered 2023-02-26: 25 ug via INTRAVENOUS

## 2023-02-26 MED ORDER — ONDANSETRON HCL 4 MG/2ML IJ SOLN
INTRAMUSCULAR | Status: AC
  Filled 2023-02-26: qty 2

## 2023-02-26 MED ORDER — FAMOTIDINE 20 MG PO TABS
20.0000 mg | ORAL_TABLET | Freq: Once | ORAL | Status: AC
  Administered 2023-02-26: 20 mg via ORAL

## 2023-02-26 MED ORDER — ORAL CARE MOUTH RINSE: 15.0000 mL | Freq: Once | OROMUCOSAL | Status: AC

## 2023-02-26 MED ORDER — ACETAMINOPHEN 10 MG/ML IV SOLN
INTRAVENOUS | Status: DC | PRN
  Administered 2023-02-26: 1000 mg via INTRAVENOUS

## 2023-02-26 MED ORDER — TRAMADOL HCL 50 MG PO TABS: 50.0000 mg | ORAL_TABLET | Freq: Four times a day (QID) | ORAL | 0 refills | Status: AC | PRN

## 2023-02-26 MED ORDER — ACETAMINOPHEN 10 MG/ML IV SOLN: 1000.0000 mg | Freq: Once | INTRAVENOUS | Status: DC | PRN

## 2023-02-26 MED ORDER — PROPOFOL 10 MG/ML IV BOLUS
INTRAVENOUS | Status: AC
  Filled 2023-02-26: qty 20

## 2023-02-26 MED ORDER — SODIUM CHLORIDE 0.9 % IR SOLN
Status: DC | PRN
  Administered 2023-02-26: 3000 mL

## 2023-02-26 MED ORDER — ONDANSETRON HCL 4 MG/2ML IJ SOLN
INTRAMUSCULAR | Status: DC | PRN
  Administered 2023-02-26: 4 mg via INTRAVENOUS

## 2023-02-26 MED ORDER — CEFAZOLIN SODIUM-DEXTROSE 2-4 GM/100ML-% IV SOLN
2.0000 g | INTRAVENOUS | Status: AC
  Administered 2023-02-26: 2 g via INTRAVENOUS

## 2023-02-26 MED ORDER — FENTANYL CITRATE (PF) 100 MCG/2ML IJ SOLN
25.0000 ug | INTRAMUSCULAR | Status: DC | PRN
  Administered 2023-02-26: 50 ug via INTRAVENOUS
  Administered 2023-02-26: 25 ug via INTRAVENOUS

## 2023-02-26 MED ORDER — ACETAMINOPHEN 10 MG/ML IV SOLN
INTRAVENOUS | Status: AC
  Filled 2023-02-26: qty 100

## 2023-02-26 MED ORDER — CEPHALEXIN 500 MG PO CAPS: 500.0000 mg | ORAL_CAPSULE | Freq: Every day | ORAL | 0 refills | Status: DC

## 2023-02-26 MED ORDER — LACTATED RINGERS IV SOLN: INTRAVENOUS | Status: DC

## 2023-02-26 MED ORDER — IOHEXOL 180 MG/ML  SOLN
INTRAMUSCULAR | Status: DC | PRN
  Administered 2023-02-26: 8 mL

## 2023-02-26 MED ORDER — FAMOTIDINE 20 MG PO TABS
ORAL_TABLET | ORAL | Status: AC
  Filled 2023-02-26: qty 1

## 2023-02-26 MED ORDER — 0.9 % SODIUM CHLORIDE (POUR BTL) OPTIME
TOPICAL | Status: DC | PRN
  Administered 2023-02-26: 500 mL

## 2023-02-26 MED ORDER — BACITRACIN ZINC 500 UNIT/GM EX OINT
TOPICAL_OINTMENT | CUTANEOUS | Status: AC
  Filled 2023-02-26: qty 28.35

## 2023-02-26 MED ORDER — BACITRACIN 500 UNIT/GM EX OINT
TOPICAL_OINTMENT | CUTANEOUS | Status: DC | PRN
  Administered 2023-02-26: 1 via TOPICAL

## 2023-02-26 MED ORDER — PROPOFOL 10 MG/ML IV BOLUS
INTRAVENOUS | Status: DC | PRN
Start: 2023-02-26 — End: 2023-02-26
  Administered 2023-02-26: 120 mg via INTRAVENOUS

## 2023-02-26 MED ORDER — OXYCODONE HCL 5 MG PO TABS
5.0000 mg | ORAL_TABLET | Freq: Once | ORAL | Status: AC | PRN
  Administered 2023-02-26: 5 mg via ORAL

## 2023-02-26 MED ORDER — ROCURONIUM BROMIDE 100 MG/10ML IV SOLN
INTRAVENOUS | Status: DC | PRN
  Administered 2023-02-26: 20 mg via INTRAVENOUS
  Administered 2023-02-26: 40 mg via INTRAVENOUS

## 2023-02-26 MED ORDER — ROCURONIUM BROMIDE 10 MG/ML (PF) SYRINGE
PREFILLED_SYRINGE | INTRAVENOUS | Status: AC
  Filled 2023-02-26: qty 10

## 2023-02-26 MED ORDER — LIDOCAINE HCL 1 % IJ SOLN
INTRAMUSCULAR | Status: DC | PRN
  Administered 2023-02-26: 9 mL

## 2023-02-26 MED ORDER — EPHEDRINE SULFATE (PRESSORS) 50 MG/ML IJ SOLN
INTRAMUSCULAR | Status: DC | PRN
  Administered 2023-02-26: 5 mg via INTRAVENOUS

## 2023-02-26 MED ORDER — CEFAZOLIN SODIUM-DEXTROSE 2-4 GM/100ML-% IV SOLN
INTRAVENOUS | Status: AC
  Filled 2023-02-26: qty 100

## 2023-02-26 MED ORDER — LIDOCAINE HCL (CARDIAC) PF 100 MG/5ML IV SOSY
PREFILLED_SYRINGE | INTRAVENOUS | Status: DC | PRN
  Administered 2023-02-26: 80 mg via INTRAVENOUS

## 2023-02-26 MED ORDER — OXYCODONE HCL 5 MG PO TABS
ORAL_TABLET | ORAL | Status: AC
  Filled 2023-02-26: qty 1

## 2023-02-26 MED ORDER — CHLORHEXIDINE GLUCONATE 0.12 % MT SOLN
OROMUCOSAL | Status: AC
  Filled 2023-02-26: qty 15

## 2023-02-26 MED ORDER — ONDANSETRON HCL 4 MG/2ML IJ SOLN: 4.0000 mg | Freq: Once | INTRAMUSCULAR | Status: DC | PRN

## 2023-02-26 MED ORDER — OXYCODONE HCL 5 MG/5ML PO SOLN: 5.0000 mg | Freq: Once | ORAL | Status: AC | PRN

## 2023-02-26 SURGICAL SUPPLY — 57 items
ADH LQ OCL WTPRF AMP STRL LF (MISCELLANEOUS)
ADHESIVE MASTISOL STRL (MISCELLANEOUS) IMPLANT
APL PRP STRL LF DISP 70% ISPRP (MISCELLANEOUS) ×2
BAG DRAIN SIEMENS DORNER NS (MISCELLANEOUS) ×2 IMPLANT
BAG DRN NS LF (MISCELLANEOUS) ×2
BAG PRESSURE INF REUSE 3000 (BAG) ×2 IMPLANT
BLADE CLIPPER SURG (BLADE) ×2 IMPLANT
BLADE SURG 15 STRL LF DISP TIS (BLADE) ×2 IMPLANT
BLADE SURG 15 STRL SS (BLADE) ×2
BRIEF MESH DISP 2XL (UNDERPADS AND DIAPERS) ×2 IMPLANT
BRUSH SCRUB EZ 1% IODOPHOR (MISCELLANEOUS) ×2 IMPLANT
CATH URET FLEX-TIP 2 LUMEN 10F (CATHETERS) IMPLANT
CATH URETL OPEN 5X70 (CATHETERS) IMPLANT
CHLORAPREP W/TINT 26 (MISCELLANEOUS) ×2 IMPLANT
CNTNR URN SCR LID CUP LEK RST (MISCELLANEOUS) IMPLANT
CONT SPEC 4OZ STRL OR WHT (MISCELLANEOUS)
DRAIN PENROSE 12X.25 LTX STRL (MISCELLANEOUS) IMPLANT
DRAPE LAPAROTOMY 77X122 PED (DRAPES) ×2 IMPLANT
DRAPE UTILITY 15X26 TOWEL STRL (DRAPES) ×2 IMPLANT
DRSG GAUZE FLUFF 36X18 (GAUZE/BANDAGES/DRESSINGS) ×2 IMPLANT
DRSG TEGADERM 2-3/8X2-3/4 SM (GAUZE/BANDAGES/DRESSINGS) IMPLANT
DRSG TELFA 3X4 N-ADH STERILE (GAUZE/BANDAGES/DRESSINGS) ×2 IMPLANT
ELECT REM PT RETURN 9FT ADLT (ELECTROSURGICAL) ×2
ELECTRODE REM PT RTRN 9FT ADLT (ELECTROSURGICAL) ×2 IMPLANT
FIBER LASER MOSES 200 DFL (Laser) IMPLANT
GAUZE 4X4 16PLY ~~LOC~~+RFID DBL (SPONGE) ×2 IMPLANT
GAUZE SPONGE 4X4 12PLY STRL (GAUZE/BANDAGES/DRESSINGS) IMPLANT
GLOVE BIOGEL PI IND STRL 7.5 (GLOVE) ×2 IMPLANT
GOWN STRL REUS W/ TWL LRG LVL3 (GOWN DISPOSABLE) ×2 IMPLANT
GOWN STRL REUS W/ TWL XL LVL3 (GOWN DISPOSABLE) ×2 IMPLANT
GOWN STRL REUS W/TWL LRG LVL3 (GOWN DISPOSABLE) ×2
GOWN STRL REUS W/TWL XL LVL3 (GOWN DISPOSABLE) ×2
GUIDEWIRE STR DUAL SENSOR (WIRE) ×2 IMPLANT
IV NS IRRIG 3000ML ARTHROMATIC (IV SOLUTION) ×2 IMPLANT
KIT TURNOVER CYSTO (KITS) ×2 IMPLANT
KIT TURNOVER KIT A (KITS) ×2 IMPLANT
LABEL OR SOLS (LABEL) ×2 IMPLANT
MANIFOLD NEPTUNE II (INSTRUMENTS) ×2 IMPLANT
NDL HYPO 25X1 1.5 SAFETY (NEEDLE) ×2 IMPLANT
NEEDLE HYPO 25X1 1.5 SAFETY (NEEDLE) ×2 IMPLANT
NS IRRIG 500ML POUR BTL (IV SOLUTION) ×2 IMPLANT
PACK BASIN MINOR ARMC (MISCELLANEOUS) ×2 IMPLANT
PACK CYSTO AR (MISCELLANEOUS) ×2 IMPLANT
SET CYSTO W/LG BORE CLAMP LF (SET/KITS/TRAYS/PACK) ×2 IMPLANT
SHEATH NAVIGATOR HD 12/14X36 (SHEATH) IMPLANT
STENT URET 6FRX24 CONTOUR (STENTS) IMPLANT
STENT URET 6FRX26 CONTOUR (STENTS) IMPLANT
SURGILUBE 2OZ TUBE FLIPTOP (MISCELLANEOUS) ×2 IMPLANT
SUT CHROMIC 3 0 PS 2 (SUTURE) ×2 IMPLANT
SUT ETHILON 3-0 FS-10 30 BLK (SUTURE)
SUT VIC AB 2-0 SH 27 (SUTURE) ×4
SUT VIC AB 2-0 SH 27XBRD (SUTURE) ×4 IMPLANT
SUTURE EHLN 3-0 FS-10 30 BLK (SUTURE) IMPLANT
SYR 10ML LL (SYRINGE) ×2 IMPLANT
TRAP FLUID SMOKE EVACUATOR (MISCELLANEOUS) ×2 IMPLANT
VALVE UROSEAL ADJ ENDO (VALVE) IMPLANT
WATER STERILE IRR 500ML POUR (IV SOLUTION) ×2 IMPLANT

## 2023-02-26 NOTE — Anesthesia Postprocedure Evaluation (Signed)
Anesthesia Post Note  Patient: RUDI VOLNER  Procedure(s) Performed: CYSTOSCOPY/URETEROSCOPY/HOLMIUM LASER/STENT PLACEMENT (Right: Ureter) HYDROCELECTOMY ADULT (Bilateral: Scrotum)  Patient location during evaluation: PACU Anesthesia Type: General Level of consciousness: awake and alert Pain management: pain level controlled Vital Signs Assessment: post-procedure vital signs reviewed and stable Respiratory status: spontaneous breathing, nonlabored ventilation, respiratory function stable and patient connected to nasal cannula oxygen Cardiovascular status: blood pressure returned to baseline and stable Postop Assessment: no apparent nausea or vomiting Anesthetic complications: no   No notable events documented.   Last Vitals:  Vitals:   02/26/23 1644 02/26/23 1656  BP: (!) 155/73 (!) 170/81  Pulse: 65 60  Resp: 19 18  Temp: (!) 36.3 C (!) 36.2 C  SpO2: 97% 95%    Last Pain:  Vitals:   02/26/23 1656  TempSrc: Temporal  PainSc: 3                  Cleda Mccreedy Deundre Thong

## 2023-02-26 NOTE — H&P (Signed)
02/26/23 2:29 PM   Jonny Ruiz Konrad Felix 1940/12/12 [id number]  CC: Gross hematuria, ureteral stone, hydroceles  HPI: 82 year old male with distant history of open prostatectomy by Dr. Achilles Dunk who presented with gross hematuria.  CT showed an 8 mm right mid ureteral stone.  He also has bilateral large symptomatic hydroceles.   PMH: Past Medical History:  Diagnosis Date   Acid reflux    Aortic atherosclerosis (HCC)    Aortic stenosis 03/15/2020   a.) stress TTE 03/15/2020: mild (MPG 10.8 mmHg; AVA = 1.3cm2)   Arthritis    Ascending aorta dilatation (HCC) 04/16/2020   a.) CT chest 04/16/2020: asc thoracic Ao measured 39 mm   Atrial fibrillation (HCC)    a.) CHA2DS2VASc = 4 (age x2, HTN, vascular disease history);  b.) rate/rhythm maintained on oral flecainide + metoprolol tartrate; chronically anticoagulated with dose reduced apixaban   Bladder outlet obstruction    CAD (coronary artery disease) 09/10/2022   a.) cCTA 09/10/2022: Ca2+ 39.7 (11th %ile for age/sex/race matched control); < 25% pLAD   Cholelithiasis with choledocholithiasis    Colon polyps    DDD (degenerative disc disease), cervical    Diastolic dysfunction 02/25/2017   a.) TTE 02/25/2017: EF >55%, mild LVH, mild BAE, mild MAC, triv AR/MR/PR, mild TR, G1DD; b.) stress TTE 08/30/2017: EF >55%, triv panval regurg, AoV sclerosis; c.) TTE 03/23/2019: EF >55%, mild LVH, triv panval regurg, G1DD; d.) stress TTE 03/15/2020: EF >55%, triv panval regurg   Diverticulosis    ED (erectile dysfunction) of organic origin    Hydrocele, bilateral    Hyperlipidemia    Hypertension    LBBB (left bundle branch block)    Long term current use of anticoagulant    a.) apixaban   Lumbar stenosis    a.) s/p lumbar laminectomy in 2008   Nephrolithiasis    Pneumonia    Prostate cancer (HCC)    a.) s/p retropubic prostatectomy 10/2005   Pulmonary fibrosis (HCC)    Tinnitus     Surgical History: Past Surgical History:  Procedure  Laterality Date   BACK SURGERY     laminectomy lumbar   COLONOSCOPY WITH PROPOFOL N/A 08/01/2015   Procedure: COLONOSCOPY WITH PROPOFOL;  Surgeon: Scot Jun, MD;  Location: [facility] ENDOSCOPY;  Service: Endoscopy;  Laterality: N/A;   COLONOSCOPY WITH PROPOFOL N/A 01/22/2021   Procedure: COLONOSCOPY WITH PROPOFOL;  Surgeon: Toledo, Boykin Nearing, MD;  Location: [facility] ENDOSCOPY;  Service: Gastroenterology;  Laterality: N/A;   PERINEAL PROSTATECTOMY     pointer finger surgery Left 05/2019   POLYPECTOMY     TONSILLECTOMY       Family History: History reviewed. No pertinent family history.  Social History:  reports that he quit smoking about 35 years ago. His smoking use included cigarettes. He started smoking about 65 years ago. He has a 60 pack-year smoking history. He has been exposed to tobacco smoke. He has never used smokeless tobacco. He reports current alcohol use of about 1.0 standard drink of alcohol per week. He reports that he does not use drugs.  Physical Exam: BP (!) 143/73   Pulse (!) 59   Temp 97.6 F (36.4 C) (Temporal)   Resp 18   Ht 5\' 11"  (1.803 m)   Wt 82.6 kg   SpO2 97%   BMI 25.38 kg/m    Constitutional:  Alert and oriented, No acute distress. Cardiovascular: Regular rate and rhythm Respiratory: Clear to auscultation bilaterally GI: Abdomen is soft, nontender, nondistended, no abdominal masses  Laboratory Data: Urine culture 7/9 no growth  Assessment & Plan:   82 year old male who presented with gross hematuria and was found have an 8 mm right mid ureteral stone, also with bilateral large hydroceles that were symptomatic.  We specifically discussed the risks ureteroscopy including bleeding, infection/sepsis, stent related symptoms including flank pain/urgency/frequency/incontinence/dysuria, ureteral injury, inability to access stone, or need for staged or additional procedures.  We also reviewed the risks and benefits of hydrocele repair including  bleeding, infection, bruising/swelling that can take 6 to 8 weeks to resolve, 5% risk of recurrence, postoperative pain.  Right ureteroscopy, laser lithotripsy, stent placement, bilateral hydrocele repair  Legrand Rams, MD 02/26/2023  [facility] [address], [address] [place], [place] [address] [phone]

## 2023-02-26 NOTE — Transfer of Care (Signed)
Immediate Anesthesia Transfer of Care Note  Patient: John Marks  Procedure(s) Performed: CYSTOSCOPY/URETEROSCOPY/HOLMIUM LASER/STENT PLACEMENT (Right: Ureter) HYDROCELECTOMY ADULT (Bilateral: Scrotum)  Patient Location: PACU  Anesthesia Type:General  Level of Consciousness: awake  Airway & Oxygen Therapy: Patient Spontanous Breathing and Patient connected to face mask oxygen  Post-op Assessment: Report given to RN and Post -op Vital signs reviewed and stable  Post vital signs: Reviewed and stable  Last Vitals:  Vitals Value Taken Time  BP 115/101 02/26/23 1605  Temp 36.2 C 02/26/23 1605  Pulse 61 02/26/23 1610  Resp 11 02/26/23 1610  SpO2 90 % 02/26/23 1610  Vitals shown include unfiled device data.  Last Pain:  Vitals:   02/26/23 1341  TempSrc: Temporal  PainSc: 0-No pain      Patients Stated Pain Goal: 0 (02/26/23 1341)  Complications: No notable events documented.

## 2023-02-26 NOTE — Anesthesia Preprocedure Evaluation (Addendum)
Anesthesia Evaluation  Patient identified by MRN, date of birth, ID band Patient awake    Reviewed: Allergy & Precautions, NPO status , Patient's Chart, lab work & pertinent test results  History of Anesthesia Complications Negative for: history of anesthetic complications  Airway Mallampati: III   Neck ROM: Full    Dental  (+) Missing   Pulmonary former smoker (quit 1989)   Pulmonary exam normal breath sounds clear to auscultation       Cardiovascular hypertension, Normal cardiovascular exam+ dysrhythmias (a fib on Eliquis)  Rhythm:Regular Rate:Normal  ECG 02/19/23:  Sinus bradycardia Left axis deviation Left ventricular hypertrophy with QRS widening ( R in aVL , Cornell product )  Echo 03/15/20:  Normal Stress Echocardiogram NORMAL RIGHT VENTRICULAR SYSTOLIC FUNCTION MILD VALVULAR REGURGITATION  NO VALVULAR STENOSIS NOTED Resting EF: >55% (Est.) Post Stress EF: >55% (Est.) ECG Results: Normal Aortic: TRIVIAL AR Mitral: TRIVIAL MR Tricuspid: TRIVIAL TR     Neuro/Psych HOH    GI/Hepatic ,GERD  ,,  Endo/Other  negative endocrine ROS    Renal/GU    Prostate CA    Musculoskeletal  (+) Arthritis ,    Abdominal   Peds  Hematology negative hematology ROS (+)   Anesthesia Other Findings Reviewed and agree with Edd Fabian pre-anesthesia clinical review note.    Cardiology note 09/22/22:  82 y.o. male with  1. PAF (paroxysmal atrial fibrillation) (CMS-HCC)  2. Primary hypertension  3. Mixed hyperlipidemia   Plan   Paroxysmal atrial fibrillation, continue metoprolol and flecainide for rate/rhythm management. Continue Eliquis for anticoagulation therapy.  Hypertension, well controlled, continue metoprolol Hyperlipidemia, continue Lipitor for lipid management  Have patient follow up in 1 year  Return in about 1 year (around 09/22/2023).    Reproductive/Obstetrics                              Anesthesia Physical Anesthesia Plan  ASA: 3  Anesthesia Plan: General   Post-op Pain Management:    Induction: Intravenous  PONV Risk Score and Plan: 2 and Ondansetron, Dexamethasone and Treatment may vary due to age or medical condition  Airway Management Planned: Oral ETT  Additional Equipment:   Intra-op Plan:   Post-operative Plan: Extubation in OR  Informed Consent: I have reviewed the patients History and Physical, chart, labs and discussed the procedure including the risks, benefits and alternatives for the proposed anesthesia with the patient or authorized representative who has indicated his/her understanding and acceptance.     Dental advisory given  Plan Discussed with: CRNA  Anesthesia Plan Comments: (Patient consented for risks of anesthesia including but not limited to:  - adverse reactions to medications - damage to eyes, teeth, lips or other oral mucosa - nerve damage due to positioning  - sore throat or hoarseness - damage to heart, brain, nerves, lungs, other parts of body or loss of life  Informed patient about role of CRNA in peri- and intra-operative care.  Patient voiced understanding.)        Anesthesia Quick Evaluation

## 2023-02-26 NOTE — Anesthesia Procedure Notes (Signed)
Procedure Name: Intubation Date/Time: 02/26/2023 2:45 PM  Performed by: Elisabeth Pigeon, CRNAPre-anesthesia Checklist: Patient identified, Patient being monitored, Timeout performed, Emergency Drugs available and Suction available Patient Re-evaluated:Patient Re-evaluated prior to induction Oxygen Delivery Method: Circle system utilized Preoxygenation: Pre-oxygenation with 100% oxygen Induction Type: IV induction Ventilation: Mask ventilation without difficulty Laryngoscope Size: Mac, McGraph and 4 Grade View: Grade I Tube type: Oral Tube size: 7.5 mm Number of attempts: 1 Airway Equipment and Method: Stylet Placement Confirmation: ETT inserted through vocal cords under direct vision, positive ETCO2 and breath sounds checked- equal and bilateral Secured at: 22 cm Tube secured with: Tape Dental Injury: Teeth and Oropharynx as per pre-operative assessment

## 2023-02-26 NOTE — Op Note (Addendum)
Date of procedure: 02/26/23  Preoperative diagnosis:  Right mid ureteral stone Right hydrocele Left hydrocele  Postoperative diagnosis:  Same  Procedure: Cystoscopy, right ureteroscopy, laser lithotripsy, right retrograde pyelogram with intraoperative interpretation, right ureteral stent placement Right hydrocelectomy Left hydrocelectomy  Surgeon: Legrand Rams, MD  Anesthesia: General  Complications: None  Intraoperative findings:  8 mm stone lodged in the right mid ureter, dusted and stent placed Uncomplicated bilateral hydrocelectomy  EBL: Minimal  Specimens: None  Drains: Right 6 French by 26 cm ureteral stent  Indication: John Marks is a 82 y.o. patient with gross hematuria and CT showing an 8 mm right mid ureteral stone.  He also had large bilateral hydroceles and opted for repair.  After reviewing the management options for treatment, they elected to proceed with the above surgical procedure(s). We have discussed the potential benefits and risks of the procedure, side effects of the proposed treatment, the likelihood of the patient achieving the goals of the procedure, and any potential problems that might occur during the procedure or recuperation. Informed consent has been obtained.  Description of procedure:  The patient was taken to the operating room and general anesthesia was induced. SCDs were placed for DVT prophylaxis. The patient was placed in the dorsal lithotomy position, prepped and draped in the usual sterile fashion, and preoperative antibiotics(Ancef) were administered. A preoperative time-out was performed.   A 21 French rigid cystoscope was used to intubate the urethra and a normal-appearing urethra was followed proximally to the bladder.  The prostate was surgically absent.  Thorough cystoscopy showed no abnormal findings.  A sensor wire was used to intubate the right ureteral orifice and passed up to the kidney under fluoroscopic vision.  A  semirigid long ureteroscope was advanced alongside the wire and there was an 8 mm yellow stone lodged in the mid ureter.  This was dusted with a 200 m laser fiber on settings of 1.0 J and 10 Hz.  All fragments were irrigated free from the ureter.  A retrograde pyelogram was performed which showed no extravasation or filling defects.  Pullback ureteroscopy showed no other stone fragments or ureteral injury.  The rigid cystoscope was backloaded over the wire and a 6 [place] by 26 cm ureteral stent was uneventfully placed on the right side with a curl in the kidney as well as in the bladder under direct vision.  The bladder was drained.  He was then taken down from dorsal lithotomy and placed in supine position and reprepped and draped in standard sterile fashion supine.  A 5 cm incision was made in the right hemiscrotum and dissected down to tunica vaginalis.  Blunt dissection was performed circumferentially, and the testicle with large hydrocele was delivered onto the operative field.  The tunica vaginalis was incised and yellow straw-colored fluid was drained.  Meticulous hemostasis was achieved.  A jaboulee repair was performed using a 2-0 Vicryl.  The right hemiscrotum was irrigated.  Dartos was closed with running 2-0 Vicryl, and the skin was closed with a running 3-0 chromic.    I turned my attention to the left side. A 5 cm incision was made in the left hemiscrotum and dissected down to tunica vaginalis.  Blunt dissection was performed circumferentially, and the testicle with moderate sized hydrocele was delivered onto the operative field.  The tunica vaginalis was incised and yellow straw-colored fluid was drained.  Meticulous hemostasis was achieved.  A jaboulee repair was performed using a 2-0 Vicryl.  The left hemiscrotum was  irrigated.  Dartos was closed with running 2-0 Vicryl, and the skin was closed with a running 3-0 chromic.   A dressing of bacitracin, Telfa's, scrotal fluffs, and mesh pants  were applied.  Disposition: Stable to PACU  Plan: Stent removal in clinic in 1 week Hydrocele wound check in 3 months with MD  Legrand Rams, MD

## 2023-02-26 NOTE — Discharge Instructions (Signed)

## 2023-02-27 ENCOUNTER — Encounter: Payer: Self-pay | Admitting: Urology

## 2023-03-09 ENCOUNTER — Ambulatory Visit: Payer: Medicare HMO | Admitting: Urology

## 2023-03-09 ENCOUNTER — Encounter: Payer: Self-pay | Admitting: Urology

## 2023-03-09 VITALS — BP 154/77 | HR 55 | Ht 71.0 in | Wt 198.0 lb

## 2023-03-09 DIAGNOSIS — Z466 Encounter for fitting and adjustment of urinary device: Secondary | ICD-10-CM | POA: Diagnosis not present

## 2023-03-09 MED ORDER — CEPHALEXIN 250 MG PO CAPS
500.0000 mg | ORAL_CAPSULE | Freq: Once | ORAL | Status: AC
Start: 2023-03-09 — End: 2023-03-09
  Administered 2023-03-09: 500 mg via ORAL

## 2023-03-09 NOTE — Progress Notes (Signed)
Cystoscopy Procedure Note:  Indication: Stent removal s/p right ureteroscopy, laser lithotripsy for 8 mm right mid ureteral stone (also had simultaneous bilateral large hydrocelectomy)  Keflex given for prophylaxis  After informed consent and discussion of the procedure and its risks, John Marks was positioned and prepped in the standard fashion. Cystoscopy was performed with a flexible cystoscope. The stent was grasped with flexible graspers and removed in its entirety. The patient tolerated the procedure well.  Findings: Uncomplicated stent removal  Assessment and Plan: Hydrocele incisions healing nicely  RTC 2 to 3 months hydrocele wound check with me   Sondra Come, MD 03/09/2023

## 2023-03-19 ENCOUNTER — Telehealth: Payer: Self-pay

## 2023-03-19 NOTE — Telephone Encounter (Signed)
Pt calls triage line and states he is concerned about his incision site as he has noticed increased redness and pain. He would like to be seen for reassurance. Pt scheduled.

## 2023-03-23 ENCOUNTER — Ambulatory Visit: Payer: Medicare HMO | Admitting: Urology

## 2023-03-23 DIAGNOSIS — Z87438 Personal history of other diseases of male genital organs: Secondary | ICD-10-CM

## 2023-03-23 DIAGNOSIS — Z09 Encounter for follow-up examination after completed treatment for conditions other than malignant neoplasm: Secondary | ICD-10-CM

## 2023-03-23 DIAGNOSIS — N433 Hydrocele, unspecified: Secondary | ICD-10-CM

## 2023-03-23 NOTE — Patient Instructions (Signed)
Continue to wash your incisions gently with soap and water and pat dry.  Put small strip of Telfa on each incision, this will help things heal and prevent from sticking to close or getting drainage in your underwear.  If you are still having some problems with drainage you can place some gauze between the Telfa and your underwear.  If you have significant redness at your incisions, pain, or fever, please let us know.  Based on your wound check today, these will take an additional 1 to 3 weeks to heal completely.

## 2023-03-23 NOTE — Progress Notes (Signed)
   03/23/2023 8:23 AM   John Marks 03-04-1941 [id number]  Reason for visit: Wound check  HPI: 82 year old male who underwent simultaneous right ureteroscopy and laser lithotripsy for an 8 mm right mid ureteral stone and bilateral large hydrocelectomy on 02/26/2023.  Stent was removed on 03/09/2023, hydrocele incisions were healing nicely at that time with some small open areas where chromic sutures had dissolved but had no evidence of infection and were healing by secondary intention.  Today he reports some drainage from his incisions.  He is not having any pain, redness, or fever.  On exam, there is a small area <1cm bilaterally where incisions are healing by secondary intention after chromic suture dissolved.  No evidence of infection.  Reassurance provided, Telfa given to apply to incisions when wearing closed to prevent any sticking or drainage.  Return precautions were discussed.  Keep follow-up as scheduled late November for wound check, sooner if problems  Sondra Come, MD  [facility] [address] [place], [place] [address] [phone]

## 2023-06-09 ENCOUNTER — Ambulatory Visit: Payer: Medicare HMO | Admitting: Urology

## 2023-06-10 ENCOUNTER — Ambulatory Visit: Payer: Medicare HMO | Admitting: Urology

## 2023-06-10 ENCOUNTER — Encounter: Payer: Self-pay | Admitting: Urology

## 2023-06-10 VITALS — BP 133/77 | HR 66 | Ht 72.0 in | Wt 200.0 lb

## 2023-06-10 DIAGNOSIS — Z87442 Personal history of urinary calculi: Secondary | ICD-10-CM

## 2023-06-10 DIAGNOSIS — Z09 Encounter for follow-up examination after completed treatment for conditions other than malignant neoplasm: Secondary | ICD-10-CM

## 2023-06-10 DIAGNOSIS — Z87438 Personal history of other diseases of male genital organs: Secondary | ICD-10-CM | POA: Diagnosis not present

## 2023-06-10 DIAGNOSIS — N200 Calculus of kidney: Secondary | ICD-10-CM

## 2023-06-10 DIAGNOSIS — N432 Other hydrocele: Secondary | ICD-10-CM

## 2023-06-10 NOTE — Progress Notes (Signed)
   06/10/2023 3:21 PM   [name] 1940/10/17 [id number]  Reason for visit: Follow up bilateral hydrocelectomy, nephrolithiasis  HPI: 82 year old male who underwent simultaneous right ureteroscopy laser lithotripsy for an 8 mm ureteral stone as well as bilateral large hydrocelectomy on 02/26/2023.  He has been doing well since that time.  On exam incisions have healed well bilaterally in the scrotum, no significant edema or swelling noted.  No significant tenderness.  We discussed general stone prevention strategies including adequate hydration with goal of producing 2.5 L of urine daily, increasing citric acid intake, increasing calcium intake during high oxalate meals, minimizing animal protein, and decreasing salt intake. Information about dietary recommendations given today.   Follow-up with urology as needed  Sondra Come, MD  [facility] [address] [place], [place] [address] [phone]

## 2023-07-23 NOTE — Progress Notes (Signed)
  Patient: John Marks      DOB: August 22, 1940  DOS: 07/23/2023 Provider: Yancy Bring, ATC  Chief Complaint  Patient presents with  . Left Hip - Post Operative Visit     History of Present Illness: Patient is approximately 2 weeks post-op left total hip arthroplasty. Since the last visit, he reports no issues. He has been taking DVT ppx EC ASA 81mg  bid and is taking OTC medication (tylenol , NSAID) for pain, which the patient describes as mild . He ambulates with one crutch and has been doing independent home exercises.      BMI:    Physical Exam: The incision is intact.  Appropriate wound management initiated without excessive bruising, redness, erythema, or any signs of obvious infection. The patient is neurovascularly intact, moving the toes well distally with minimal swelling. Ipsilateral LE motor function was 5/5 with knee extensors, foot dorsiflexors, foot plantar flexors, and EHL. Hip flexors strength slightly reduced at 4/5.  Pulses are intact.  Range of motion not aggressively tested. mild  pain with gentle ROM.   Imaging:  AP pelvis and lateral hip x-rays left hip taken today show a stable  primary total hip replacement in good alignment and with good positioning. No loosening, fractures or other acute processes appreciated.  Assessment:   ICD-10-CM  1. 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 Aftercare following left hip joint replacement surgery  Z47.1   S03.357       Plan: The findings were discussed with the patient. He is progressing as expected postop following surgery. All questions and concerns were answered. He can call any time with further concerns.   We will continue with our post-operative protocol.  I have reviewed post-op instructions with the patient. He will continue HEP Continue home medication of Eliquis   for DVT prophylaxis. No refills were given at this appointment.   Follow up appointment scheduled for 4 weeks for his 6 week post op  appointment with Dr. Aimee. At next visit we will obtain updated films of their left hip.  I personally performed the service, non-incident to.  (WP) Yancy Bring ATC

## 2023-07-26 NOTE — Progress Notes (Signed)
 John Marks is a  83 y.o. male who presents for  CHIEF COMPLAINT Chief Complaint  Patient presents with  . Follow-up  . Hypertension  . Hyperlipidemia  . Atrial Fibrillation    Subjective: History of Present Illness  Pt in NAD. HTN stable on meds. Has HLD on statin, PAF on Flecainide/Eliquis  and B12 def on po supplement. Now s/p hip surgery. Weight stable. BP has been elevated at home. Sleeping OK. No fever or HA's. Denies Cp, some SOB. No cough or wheezing. No palpitations. No LE edema. No change in bowels or bladder.    Past Medical History:  Diagnosis Date  . Aortic valve stenosis 06/09/2023  . Cervical spondylosis 11/02/2022  . Chronic kidney disease 7/15   Kidney stone  . Colon polyps   . DDD (degenerative disc disease)   . Depression 8/1  . Diverticulosis   . Epicondylitis   . GERD (gastroesophageal reflux disease)   . Heart disease Atrial Fibrillation  . HTN (hypertension)   . Hyperlipidemia   . Hyperlipidemia   . ILD (interstitial lung disease) (CMS/HHS-HCC) 07/31/2020  . Osteoarthritis of knee 12/07/2018  . Primary osteoarthritis of left hip 06/09/2023  . Prostate CA (CMS/HHS-HCC)   . Tendinitis   . Tinnitus    Patient Active Problem List  Diagnosis  . Hyperlipidemia  . HTN (hypertension)  . Diverticulosis  . Disc disease, degenerative, lumbar or lumbosacral  . Tinnitus  . PAF (paroxysmal atrial fibrillation) (CMS/HHS-HCC)  . Personal history of prostate cancer  . ED (erectile dysfunction) of organic origin  . Osteoarthritis of knee  . ILD (interstitial lung disease) (CMS/HHS-HCC)  . Rotator cuff tendinitis, left  . Cervical spondylosis  . LBBB (left bundle branch block)  . Aortic valve stenosis  . History of total left hip replacement 07/06/23  . Aftercare following left hip joint replacement surgery  . B12 deficiency    Past Surgical History:  Procedure Laterality Date  . COLONOSCOPY  05/30/1996   Adenomatous Polyp  . COLONOSCOPY   05/22/1999   Adenomatous Polyp  . COLONOSCOPY  08/18/2001   PH Adenomatous Polyps  . COLONOSCOPY  07/01/2010   PH Adenomatous Polyps: CBF 06/2015; Recall Ltr mailed 05/30/2015 (dw)  . COLONOSCOPY  08/01/2015   Adenomatous Polyp: CBF 07/2020  . pointer finger surgery Left 05/2019  . COLONOSCOPY  01/22/2021   Tubular adenomas/Sessile serrated adenoma/50yrs/TKT  .  ureteroscopy and laser lithotripsy  Right 02/26/2023  . ARTHROPLASTY HIP TOTAL Left 07/06/2023   Procedure: LEFT PRIMARY TOTAL HIP ARTHROPLASTY;  Surgeon: Aimee Juliene Dunnings, MD;  Location: [facility] OR;  Service: Orthopedics;  Laterality: Left;  . BACK SURGERY  2010   Laminectomy  . PROSTATECTOMY PERINEAL    . TONSILLECTOMY  1949     Current Outpatient Medications:  .  amLODIPine (NORVASC) 5 MG tablet, Take 1 tablet (5 mg total) by mouth once daily, Disp: 90 tablet, Rfl: 3 .  amoxicillin (AMOXIL) 500 MG tablet, Take 4 tabs (2G) by mouth 1 hour before dental procedure, Disp: 8 tablet, Rfl: 2 .  atorvastatin (LIPITOR) 40 MG tablet, take 1 tablet once daily (Patient taking differently: Take 40 mg by mouth every evening), Disp: 90 tablet, Rfl: 1 .  cholecalciferol 1000 unit tablet, Take 1,000 Units by mouth once daily, Disp: , Rfl:  .  cyanocobalamin (VITAMIN B12) 1000 MCG tablet, Take 1,000 mcg by mouth once daily, Disp: , Rfl:  .  ELIQUIS  2.5 mg tablet, take 1 tablet by mouth every 12 hours., Disp:  60 tablet, Rfl: 2 .  fenofibric (TRILIPIX) 135 mg DR capsule, TAKE 1 CAPSULE ONCE DAILY, Disp: 90 capsule, Rfl: 1 .  flecainide (TAMBOCOR) 50 MG tablet, Take 1 tablet (50 mg total) by mouth 2 (two) times daily, Disp: 180 tablet, Rfl: 3 .  magnesium oxide (MAG-OXIDE ORAL), Take 500 mg by mouth once daily, Disp: , Rfl:  .  metoprolol  tartrate (LOPRESSOR ) 25 MG tablet, Take 1 tablet (25 mg total) by mouth 2 (two) times daily, Disp: 180 tablet, Rfl: 3  Patient has no known allergies.  Social History   Socioeconomic History  . Marital  status: Married  Tobacco Use  . Smoking status: Former    Current packs/day: 0.00    Average packs/day: 2.0 packs/day for 30.0 years (60.0 ttl pk-yrs)    Types: Cigarettes, Pipe, Cigars    Start date: 32    Quit date: 1986    Years since quitting: 39.0  . Smokeless tobacco: Never  Vaping Use  . Vaping status: Never Used  Substance and Sexual Activity  . Alcohol use: Not Currently    Alcohol/week: 5.0 standard drinks of alcohol    Types: 5 Standard drinks or equivalent per week  . Drug use: No  . Sexual activity: Not Currently    Partners: Female    Birth control/protection: Pill, None   Social Drivers of Health   Financial Resource Strain: Low Risk  (07/06/2023)   Overall Financial Resource Strain (CARDIA)   . Difficulty of Paying Living Expenses: Not hard at all  Food Insecurity: No Food Insecurity (07/06/2023)   Hunger Vital Sign   . Worried About Programme Researcher, Broadcasting/film/video in the Last Year: Never true   . Ran Out of Food in the Last Year: Never true  Transportation Needs: No Transportation Needs (07/06/2023)   PRAPARE - Transportation   . Lack of Transportation (Medical): No   . Lack of Transportation (Non-Medical): No  Physical Activity: Insufficiently Active (06/06/2023)   Exercise Vital Sign   . Days of Exercise per Week: 3 days   . Minutes of Exercise per Session: 30 min  Stress: No Stress Concern Present (06/06/2023)   [employer] of Occupational Health - Occupational Stress Questionnaire   . Feeling of Stress : Only a little  Social Connections: Socially Integrated (06/06/2023)   Social Connection and Isolation Panel [NHANES]   . Frequency of Communication with Friends and Family: Twice a week   . Frequency of Social Gatherings with Friends and Family: Twice a week   . Attends Religious Services: More than 4 times per year   . Active Member of Clubs or Organizations: Yes   . Attends Banker Meetings: More than 4 times per year   . Marital Status:  Married  Housing Stability: Low Risk  (07/06/2023)   Housing Stability Vital Sign   . Unable to Pay for Housing in the Last Year: No   . Number of Times Moved in the Last Year: 0   . Homeless in the Last Year: No    Family History  Problem Relation Name Age of Onset  . No Known Problems Mother    . Ulcers Father Beulah Matusek   . Lung cancer Paternal Grandmother Greg Cratty   . Anesthesia problems Neg Hx      A comprehensive ROS was negative except for HPI  PE: BP 138/68   Pulse 61   Ht 182.9 cm (6')   Wt 90.7 kg (200 lb)   SpO2 98%  BMI 27.12 kg/m  General. Alert oriented x3  Eyes. Sclera and conjunctiva clear; pupils equal round and reactive to light and accommodation; extraocular movements intact Nose. Mucosa healthy without drainage or ulceration Oropharynx. No suspicious lesions Neck. No swelling, masses, stiffness, pain, limited movement, carotid pulses normal bilaterally, thyroid  normal size, no masses palpated.  No bruits Lungs. Respirations unlabored; clear to auscultation bilaterally Back. No spinal deformity Cardiovascular. Heart regular rate and rhythm without murmurs, gallops, or rubs Abdomen. Soft; non tender; non distended; normoactive bowel sounds; no masses or organomegaly Lymph Nodes. No significant cervical, supraclavicular, axillary or inguinal lymphadenopathy noted Musculoskeletal. No deformities; no active joint inflammation Extremities. Normal, no edema Pulses. Dorsalis pedis palpable and symmetric bilaterally Neurologic. Alert and oriented; speech intact; face symmetrical; moves all extremities well  Admission on 07/06/2023, Discharged on 07/07/2023  Component Date Value Ref Range Status  . ABO RH TYPE 07/06/2023 O Positive   Final  . Antibody Screen 07/06/2023 Negative   Final  . Specimen Outdate 07/06/2023 07-09-2023 23:59   Final  . Performing Lab 07/06/2023 [facility] BLOOD BANK LAB   Final  . WBC (White Blood Cell Count) 07/07/2023 9.9 (H)  3.2 -  9.8 x10^9/L Final  . Hemoglobin 07/07/2023 12.8 (L)  13.7 - 17.3 g/dL Final  . Hematocrit 87/81/7975 39.7  39.0 - 49.0 % Final  . Platelets 07/07/2023 189  150 - 450 x10^9/L Final  . MCV (Mean Corpuscular Volume) 07/07/2023 91  80 - 98 fL Final  . MCH (Mean Corpuscular Hemoglobin) 07/07/2023 29.3  26.5 - 34.0 pg Final  . MCHC (Mean Corpuscular Hemoglobin * 07/07/2023 32.2  31.5 - 36.3 % Final  . RBC (Red Blood Cell Count) 07/07/2023 4.37  4.37 - 5.74 x10^12/L Final  . RDW-CV (Red Cell Distribution Widt* 07/07/2023 13.2  11.5 - 14.5 % Final  . NRBC (Nucleated Red Blood Cell Cou* 07/07/2023 0.00  0 x10^9/L Final  . NRBC % (Nucleated Red Blood Cell %) 07/07/2023 0.0  % Final  . MPV (Mean Platelet Volume) 07/07/2023 10.9  7.2 - 11.7 fL Final  . Sodium 07/07/2023 137  135 - 145 mmol/L Final  . Potassium 07/07/2023 4.2  3.5 - 5.0 mmol/L Final  . Chloride 07/07/2023 103  98 - 108 mmol/L Final  . Carbon Dioxide (CO2) 07/07/2023 23  21 - 30 mmol/L Final  . Urea Nitrogen (BUN) 07/07/2023 25 (H)  7 - 20 mg/dL Final  . Creatinine 87/81/7975 1.5 (H)  0.6 - 1.3 mg/dL Final  . Glucose 87/81/7975 187 (H)  70 - 140 mg/dL Final  . Calcium 87/81/7975 9.0  8.7 - 10.2 mg/dL Final  . Anion Gap 87/81/7975 11  3 - 12 mmol/L Final  . BUN/CREA Ratio 07/07/2023 17  6 - 27 Final  . Glomerular Filtration Rate (eGFR)  07/07/2023 46  mL/min/1.73sq m Final  Ancillary Procedure on 06/10/2023  Component Date Value Ref Range Status  . LV Ejection Fraction (%) 06/10/2023 >55%  % Final  . Right Ventricle Systolic Pressure * 06/10/2023 25  mmHg Final  . Left Atrium Diameter (cm) 06/10/2023 4.0  cm Final  . LV End Diastolic Diameter (cm) 06/10/2023 4.4  cm Final  . LV End Systolic Diameter (cm) 06/10/2023 3.0  cm Final  . LV Septum Wall Thickness (cm) 06/10/2023 1.4  cm Final  . LV Posterior Wall Thickness (cm) 06/10/2023 1.2  cm Final  . Tricuspid Valve Regurgitation Grade 06/10/2023 mild   Final  . Tricuspid Valve  Regurgitation Max *  06/10/2023 2.4  m/s Final  . Mitral Valve Regurgitation Grade 06/10/2023 mild   Final  . Mitral Valve Stenosis Grade 06/10/2023 none   Final  . Mitral Valve Stenosis Mean Gradien* 06/10/2023 2  mmHg Final  . Aortic Valve Regurgitation Grade 06/10/2023 mild   Final  . Aortic Valve Stenosis Grade 06/10/2023 mild   Final  . Aortic Valve Stenosis Mean Gradien* 06/10/2023 18  mmHg Final  . Aortic Valve Max Velocity (m/s) 06/10/2023 2.8  m/s Final  Office Visit on 06/09/2023  Component Date Value Ref Range Status  . Vent Rate (bpm) 06/09/2023 49   Final  . PR Interval (msec) 06/09/2023 204   Final  . QRS Interval (msec) 06/09/2023 132   Final  . QT Interval (msec) 06/09/2023 440   Final  . QTc (msec) 06/09/2023 397   Final  . ABO RH TYPE 06/09/2023 O Positive   Final  . Antibody Screen 06/09/2023 Negative   Final  . Hemoglobin A1C 06/09/2023 5.8 (H)  <5.7 % Final  . Average Blood Glucose (Calculated * 06/09/2023 118  mg/dL Final  Office Visit on 04/06/2023  Component Date Value Ref Range Status  . Cholesterol, Total 04/06/2023 122  100 - 200 mg/dL Final  . Triglyceride 90/82/7975 73  35 - 199 mg/dL Final  . HDL (High Density Lipoprotein) Cho* 04/06/2023 49.2  29.0 - 71.0 mg/dL Final  . LDL Calculated 04/06/2023 58  0 - 130 mg/dL Final  . VLDL Cholesterol 04/06/2023 15  mg/dL Final  . Cholesterol/HDL Ratio 04/06/2023 2.5   Final  . WBC (White Blood Cell Count) 04/06/2023 7.8  4.1 - 10.2 10^3/uL Final  . RBC (Red Blood Cell Count) 04/06/2023 5.01  4.69 - 6.13 10^6/uL Final  . Hemoglobin 04/06/2023 15.0  14.1 - 18.1 gm/dL Final  . Hematocrit 90/82/7975 45.7  40.0 - 52.0 % Final  . MCV (Mean Corpuscular Volume) 04/06/2023 91.2  80.0 - 100.0 fl Final  . MCH (Mean Corpuscular Hemoglobin) 04/06/2023 29.9  27.0 - 31.2 pg Final  . MCHC (Mean Corpuscular Hemoglobin * 04/06/2023 32.8  32.0 - 36.0 gm/dL Final  . Platelet Count 04/06/2023 232  150 - 450 10^3/uL Final  . RDW-CV  (Red Cell Distribution Widt* 04/06/2023 13.2  11.6 - 14.8 % Final  . MPV (Mean Platelet Volume) 04/06/2023 11.1  9.4 - 12.4 fl Final  . Neutrophils 04/06/2023 5.41  1.50 - 7.80 10^3/uL Final  . Lymphocytes 04/06/2023 1.43  1.00 - 3.60 10^3/uL Final  . Monocytes 04/06/2023 0.54  0.00 - 1.50 10^3/uL Final  . Eosinophils 04/06/2023 0.28  0.00 - 0.55 10^3/uL Final  . Basophils 04/06/2023 0.11 (H)  0.00 - 0.09 10^3/uL Final  . Neutrophil % 04/06/2023 69.3  32.0 - 70.0 % Final  . Lymphocyte % 04/06/2023 18.3  10.0 - 50.0 % Final  . Monocyte % 04/06/2023 6.9  4.0 - 13.0 % Final  . Eosinophil % 04/06/2023 3.6  1.0 - 5.0 % Final  . Basophil% 04/06/2023 1.4  0.0 - 2.0 % Final  . Immature Granulocyte % 04/06/2023 0.5  <=0.7 % Final  . Immature Granulocyte Count 04/06/2023 0.04  <=0.06 10^3/L Final  . Glucose 04/06/2023 94  70 - 110 mg/dL Final  . Sodium 90/82/7975 140  136 - 145 mmol/L Final  . Potassium 04/06/2023 4.9  3.6 - 5.1 mmol/L Final  . Chloride 04/06/2023 105  97 - 109 mmol/L Final  . Carbon Dioxide (CO2) 04/06/2023 29.3  22.0 - 32.0 mmol/L Final  .  Urea Nitrogen (BUN) 04/06/2023 24  7 - 25 mg/dL Final  . Creatinine 90/82/7975 1.2  0.7 - 1.3 mg/dL Final  . Glomerular Filtration Rate (eGFR) 04/06/2023 60 (L)  >60 mL/min/1.73sq m Final  . Calcium 04/06/2023 9.5  8.7 - 10.3 mg/dL Final  . AST  90/82/7975 18  8 - 39 U/L Final  . ALT  04/06/2023 9  6 - 57 U/L Final  . Alk Phos (alkaline Phosphatase) 04/06/2023 63  34 - 104 U/L Final  . Albumin 04/06/2023 4.2  3.5 - 4.8 g/dL Final  . Bilirubin, Total 04/06/2023 0.7  0.3 - 1.2 mg/dL Final  . Protein, Total 04/06/2023 6.9  6.1 - 7.9 g/dL Final  . A/G Ratio 90/82/7975 1.6  1.0 - 5.0 gm/dL Final  . PSA (Prostate Specific Antigen), T* 04/06/2023 <0.01 (L)  0.10 - 4.00 ng/mL Final  . Thyroid  Stimulating Hormone (TSH) 04/06/2023 2.450  0.450-5.330 uIU/ml uIU/mL Final  . Color 04/06/2023 Light Yellow  Colorless, Straw, Light Yellow, Yellow, Dark  Yellow Final  . Clarity 04/06/2023 Clear  Clear Final  . Specific Gravity 04/06/2023 1.019  1.005 - 1.030 Final  . pH, Urine 04/06/2023 5.5  5.0 - 8.0 Final  . Protein, Urinalysis 04/06/2023 Negative  Negative mg/dL Final  . Glucose, Urinalysis 04/06/2023 Negative  Negative mg/dL Final  . Ketones, Urinalysis 04/06/2023 Negative  Negative mg/dL Final  . Blood, Urinalysis 04/06/2023 Negative  Negative Final  . Nitrite, Urinalysis 04/06/2023 Negative  Negative Final  . Leukocyte Esterase, Urinalysis 04/06/2023 Negative  Negative Final  . Bilirubin, Urinalysis 04/06/2023 Negative  Negative Final  . Urobilinogen, Urinalysis 04/06/2023 0.2  0.2 - 1.0 mg/dL Final  . WBC, UA 90/82/7975 0  <=5 /hpf Final  . Red Blood Cells, Urinalysis 04/06/2023 0  <=3 /hpf Final  . Bacteria, Urinalysis 04/06/2023 0-5  0 - 5 /hpf Final  . Squamous Epithelial Cells, Urinaly* 04/06/2023 0  /hpf Final  Office Visit on 09/28/2022  Component Date Value Ref Range Status  . Cholesterol, Total 09/28/2022 127  100 - 200 mg/dL Final  . Triglyceride 96/88/7975 98  35 - 199 mg/dL Final  . HDL (High Density Lipoprotein) Cho* 09/28/2022 44.8  29.0 - 71.0 mg/dL Final  . LDL Calculated 09/28/2022 63  0 - 130 mg/dL Final  . VLDL Cholesterol 09/28/2022 20  mg/dL Final  . Cholesterol/HDL Ratio 09/28/2022 2.8   Final  . WBC (White Blood Cell Count) 09/28/2022 7.1  4.1 - 10.2 10^3/uL Final  . RBC (Red Blood Cell Count) 09/28/2022 4.98  4.69 - 6.13 10^6/uL Final  . Hemoglobin 09/28/2022 15.0  14.1 - 18.1 gm/dL Final  . Hematocrit 96/88/7975 45.4  40.0 - 52.0 % Final  . MCV (Mean Corpuscular Volume) 09/28/2022 91.2  80.0 - 100.0 fl Final  . MCH (Mean Corpuscular Hemoglobin) 09/28/2022 30.1  27.0 - 31.2 pg Final  . MCHC (Mean Corpuscular Hemoglobin * 09/28/2022 33.0  32.0 - 36.0 gm/dL Final  . Platelet Count 09/28/2022 202  150 - 450 10^3/uL Final  . RDW-CV (Red Cell Distribution Widt* 09/28/2022 12.9  11.6 - 14.8 % Final  . MPV  (Mean Platelet Volume) 09/28/2022 11.1  9.4 - 12.4 fl Final  . Neutrophils 09/28/2022 4.99  1.50 - 7.80 10^3/uL Final  . Lymphocytes 09/28/2022 1.27  1.00 - 3.60 10^3/uL Final  . Monocytes 09/28/2022 0.44  0.00 - 1.50 10^3/uL Final  . Eosinophils 09/28/2022 0.28  0.00 - 0.55 10^3/uL Final  . Basophils 09/28/2022 0.12 (H)  0.00 -  0.09 10^3/uL Final  . Neutrophil % 09/28/2022 70.1 (H)  32.0 - 70.0 % Final  . Lymphocyte % 09/28/2022 17.8  10.0 - 50.0 % Final  . Monocyte % 09/28/2022 6.2  4.0 - 13.0 % Final  . Eosinophil % 09/28/2022 3.9  1.0 - 5.0 % Final  . Basophil% 09/28/2022 1.7  0.0 - 2.0 % Final  . Immature Granulocyte % 09/28/2022 0.3  <=0.7 % Final  . Immature Granulocyte Count 09/28/2022 0.02  <=0.06 10^3/L Final  . Glucose 09/28/2022 94  70 - 110 mg/dL Final  . Sodium 96/88/7975 141  136 - 145 mmol/L Final  . Potassium 09/28/2022 4.6  3.6 - 5.1 mmol/L Final  . Chloride 09/28/2022 105  97 - 109 mmol/L Final  . Carbon Dioxide (CO2) 09/28/2022 29.7  22.0 - 32.0 mmol/L Final  . Urea Nitrogen (BUN) 09/28/2022 20  7 - 25 mg/dL Final  . Creatinine 96/88/7975 1.1  0.7 - 1.3 mg/dL Final  . Glomerular Filtration Rate (eGFR) 09/28/2022 67  >60 mL/min/1.73sq m Final  . Calcium 09/28/2022 9.7  8.7 - 10.3 mg/dL Final  . AST  96/88/7975 23  8 - 39 U/L Final  . ALT  09/28/2022 14  6 - 57 U/L Final  . Alk Phos (alkaline Phosphatase) 09/28/2022 52  34 - 104 U/L Final  . Albumin 09/28/2022 4.2  3.5 - 4.8 g/dL Final  . Bilirubin, Total 09/28/2022 0.7  0.3 - 1.2 mg/dL Final  . Protein, Total 09/28/2022 6.5  6.1 - 7.9 g/dL Final  . A/G Ratio 96/88/7975 1.8  1.0 - 5.0 gm/dL Final  . PSA (Prostate Specific Antigen), T* 09/28/2022 <0.01 (L)  0.10 - 4.00 ng/mL Final  . Thyroid  Stimulating Hormone (TSH) 09/28/2022 2.129  0.450-5.330 uIU/ml uIU/mL Final  . Color 09/28/2022 Light Yellow  Colorless, Straw, Light Yellow, Yellow, Dark Yellow Final  . Clarity 09/28/2022 Clear  Clear Final  . Specific Gravity  09/28/2022 1.019  1.005 - 1.030 Final  . pH, Urine 09/28/2022 6.0  5.0 - 8.0 Final  . Protein, Urinalysis 09/28/2022 Negative  Negative mg/dL Final  . Glucose, Urinalysis 09/28/2022 Negative  Negative mg/dL Final  . Ketones, Urinalysis 09/28/2022 Negative  Negative mg/dL Final  . Blood, Urinalysis 09/28/2022 3+ (!)  Negative Final  . Nitrite, Urinalysis 09/28/2022 Negative  Negative Final  . Leukocyte Esterase, Urinalysis 09/28/2022 Negative  Negative Final  . Bilirubin, Urinalysis 09/28/2022 Negative  Negative Final  . Urobilinogen, Urinalysis 09/28/2022 0.2  0.2 - 1.0 mg/dL Final  . WBC, UA 96/88/7975 0  <=5 /hpf Final  . Red Blood Cells, Urinalysis 09/28/2022 >182 (H)  <=3 /hpf Final  . Bacteria, Urinalysis 09/28/2022 0-5  0 - 5 /hpf Final  . Squamous Epithelial Cells, Urinaly* 09/28/2022 0  /hpf Final   DIAGNOSIS: Primary hypertension  (primary encounter diagnosis)  PAF (paroxysmal atrial fibrillation) (CMS/HHS-HCC)  ILD (interstitial lung disease) (CMS/HHS-HCC)  Mixed hyperlipidemia  B12 deficiency   PLAN: PAF- stable, same meds HTN- increase Norvasc to BID HLD- diet/exercise, labs next visit SOB- CXR today B12 def- stable, same dose CKD- push fluids, labs today RTC 6 weeks, sooner if needed     Attestation Statement:   I personally performed the service. (TP)  Reyes JONETTA Costa, MD, MD

## 2023-08-23 NOTE — Progress Notes (Signed)
   Patient: John Marks      DOB: 02/26/1941  DOS: 08/25/2023 Provider: Dr. Juliene Joy  Reason for visit: 6wk follow up left THA - 07/06/2023   History of Present Illness: Patient is approximately 6 weeks post-op left total hip arthroplasty. Since the last visit, he reports no issues. He has completed DVT ppx eliquis  and is taking OTC medication (tylenol , NSAID) for pain, which the patient describes as mild. He ambulates without assistive devices and has been doing independent home exercises.    The wound has been healing well without drainage.  Co-Morbidities Include: Afib - eliquis  HTN Prostate CA (~2007) s/p resxn OA - Mobic L2-4 lami (2012) L THA, hip insight (07/06/23, Dr. Joy)   He lives in [place] with his wife.    Occupation: ret audiological scientist   Important activities/goals: golf, hunting with dog Psychologist, clinical, fishing  BMI: 27.12 kg/m  Musculoskeletal Exam: Gait/Stance Evaluation  Gait antalgic  Limp Slight  Velocity of gait normal  Assistive Device no assitive device    Lower Extremity Exam   Left  Hip Evaluation   Skin well healed incision  Swelling none  Palpation   Maximal Tenderness nontender  Range of Motion   AROM restricted - 80%  ROM produces: no pain  Neurovascular Exam    Specifics if abnormal normal     Imaging:  AP pelvis and cross table lateral views of the left hip were taken today which show a well positioned acetabular and femoral components without radiolucencies.   Assessment:   ICD-10-CM  1. Aftercare following left hip joint replacement surgery  Z47.1   Z96.642  2. History of total left hip replacement 07/06/23  S03.357       Plan: The findings were discussed with the patient. He is progressing as expected postop following surgery. All questions and concerns were answered. He can call any time with further concerns.   1. He should continue the post-operative protocol, medications, and rehabilitation as  instructed. 2. The weight bearing status is weightbearing as tolerated. 3. He will follow-up in 8wks with [employer], PA-C. At next visit we will obtain updated films of their left hip.    Adam C. Joy, MD

## 2024-02-23 ENCOUNTER — Other Ambulatory Visit
Admission: RE | Admit: 2024-02-23 | Discharge: 2024-02-23 | Disposition: A | Discharge status: Home / Self Care | Payer: Self-pay | Source: Ambulatory Visit | Attending: Pulmonary Disease | Admitting: Pulmonary Disease

## 2024-02-23 DIAGNOSIS — R0602 Shortness of breath: Secondary | ICD-10-CM | POA: Insufficient documentation

## 2024-02-23 LAB — D-DIMER, QUANTITATIVE: D-Dimer, Quant: 0.55 ug{FEU}/mL — ABNORMAL HIGH (ref 0.00–0.50)

## 2024-02-24 ENCOUNTER — Ambulatory Visit: Payer: Self-pay | Admitting: Neurosurgery

## 2024-02-24 ENCOUNTER — Encounter: Payer: Self-pay | Admitting: Neurosurgery

## 2024-02-24 VITALS — BP 122/66 | Ht 72.0 in | Wt 200.0 lb

## 2024-02-24 DIAGNOSIS — M48062 Spinal stenosis, lumbar region with neurogenic claudication: Secondary | ICD-10-CM

## 2024-02-24 DIAGNOSIS — M47816 Spondylosis without myelopathy or radiculopathy, lumbar region: Secondary | ICD-10-CM | POA: Diagnosis not present

## 2024-02-24 DIAGNOSIS — M48061 Spinal stenosis, lumbar region without neurogenic claudication: Secondary | ICD-10-CM

## 2024-02-24 DIAGNOSIS — R29898 Other symptoms and signs involving the musculoskeletal system: Secondary | ICD-10-CM

## 2024-02-24 DIAGNOSIS — M5431 Sciatica, right side: Secondary | ICD-10-CM

## 2024-02-24 NOTE — Progress Notes (Signed)
 Follow-up note: Referring Physician:  Auston Reyes BIRCH, MD [address] [facility] [place],  [place] [address]  Primary Physician:  Auston Reyes BIRCH, MD  Chief Complaint:  lumbar symptoms   History of Present Illness: John Marks is a 83 y.o. male who presents with the chief complaint of bilateral leg pain. This started about 6 months ago largely after his hip replacement which she had in December 2024 however, he does not know any specific inciting event.  He states that he started with a personal trainer as he felt like he was having weakness in his lower extremities however his symptoms did not improve.  He was seen by someone at [facility] in June and xrays were obtained. He was told he had lumbar stenosis and they recommended and MRI of his lumbar spine which is pending.  Today he reports about 6 months of intermittent bilateral sciatic type buttock and thigh pain with a sensation of weakness/ tiredness in his bilateral calves. This is worse with walking for about 10 minutes and improves with sitting.  He denies any significant low back pain.  His symptoms are significantly impacting his quality of life. He has not had any recent lumbar injections although he is hesitant to do this as he had injections in his hip prior to his hip replacement and this delayed his ability to obtain a hip replacement.  LOV  01/13/2022 John Marks is a 83 y.o with a history of HLD, CAD, GERD, prostate CA, ILD followed up pulmonology, who is here today with a chief complaint of pain in his calves and hips with ambulating.  He states that he has been having this pain for the last 6 months without any particular inciting event. He does scribes it as a shooting pain that starts in his hips bilaterally and radiates to his calves. He states that the pain is similar on the left compared to the right. He also endorses a constant low back pain but this has been present since his surgery in 2008 and  largely minor. He states that the pain in his legs occurs when he is ambulating for more than about 100 yards and resolves within a few minutes of rest. While this is not as severe as the pain he had prior to his laminectomy in 2008 he states that it is similar prompting concern for further lumbar stenosis. He is currently taking Tylenol  as needed which does provide some relief and has not undergone any conservative treatment with physical therapy or injections. While he does endorse some urge type urinary incontinence, he denies any perineal numbness or bowel incontinence. He denies any numbness or weakness in his lower extremities.  Conservative measures:  Physical therapy: none recently Multimodal medical therapy including regular antiinflammatories: Tylenol  with some relief Injections: no recent epidural steroid injections  Past Surgery: lumbar laminectomy in 2008 with Dr. Kathi [name] has no symptoms of cervical myelopathy.  The symptoms are causing a significant impact on the patient's life.   Review of Systems:  A 10 point review of systems is negative,  and the pertinent positives and negatives detailed in the HPI.  Past Medical History: Past Medical History:  Diagnosis Date   Acid reflux    Aortic atherosclerosis (HCC)    Aortic stenosis 03/15/2020   a.) stress TTE 03/15/2020: mild (MPG 10.8 mmHg; AVA = 1.3cm2)   Arthritis    Ascending aorta dilatation (HCC) 04/16/2020   a.) CT chest  04/16/2020: asc thoracic Ao measured 39 mm   Atrial fibrillation (HCC)    a.) CHA2DS2VASc = 4 (age x2, HTN, vascular disease history);  b.) rate/rhythm maintained on oral flecainide + metoprolol  tartrate; chronically anticoagulated with dose reduced apixaban    Bladder outlet obstruction    CAD (coronary artery disease) 09/10/2022   a.) cCTA 09/10/2022: Ca2+ 39.7 (11th %ile for age/sex/race matched control); < 25% pLAD   Cholelithiasis with choledocholithiasis    Colon polyps    DDD  (degenerative disc disease), cervical    Diastolic dysfunction 02/25/2017   a.) TTE 02/25/2017: EF >55%, mild LVH, mild BAE, mild MAC, triv AR/MR/PR, mild TR, G1DD; b.) stress TTE 08/30/2017: EF >55%, triv panval regurg, AoV sclerosis; c.) TTE 03/23/2019: EF >55%, mild LVH, triv panval regurg, G1DD; d.) stress TTE 03/15/2020: EF >55%, triv panval regurg   Diverticulosis    ED (erectile dysfunction) of organic origin    Hydrocele, bilateral    Hyperlipidemia    Hypertension    LBBB (left bundle branch block)    Long term current use of anticoagulant    a.) apixaban    Lumbar stenosis    a.) s/p lumbar laminectomy in 2008   Nephrolithiasis    Pneumonia    Prostate cancer (HCC)    a.) s/p retropubic prostatectomy 10/2005   Pulmonary fibrosis (HCC)    Tinnitus     Past Surgical History: Past Surgical History:  Procedure Laterality Date   BACK SURGERY     laminectomy lumbar   COLONOSCOPY WITH PROPOFOL  N/A 08/01/2015   Procedure: COLONOSCOPY WITH PROPOFOL ;  Surgeon: Lamar ONEIDA Holmes, MD;  Location: [facility] ENDOSCOPY;  Service: Endoscopy;  Laterality: N/A;   COLONOSCOPY WITH PROPOFOL  N/A 01/22/2021   Procedure: COLONOSCOPY WITH PROPOFOL ;  Surgeon: Toledo, Ladell POUR, MD;  Location: [facility] ENDOSCOPY;  Service: Gastroenterology;  Laterality: N/A;   CYSTOSCOPY/URETEROSCOPY/HOLMIUM LASER/STENT PLACEMENT Right 02/26/2023   Procedure: CYSTOSCOPY/URETEROSCOPY/HOLMIUM LASER/STENT PLACEMENT;  Surgeon: Francisca Redell BROCKS, MD;  Location: [facility] ORS;  Service: Urology;  Laterality: Right;   HYDROCELE EXCISION Bilateral 02/26/2023   Procedure: HYDROCELECTOMY ADULT;  Surgeon: Francisca Redell BROCKS, MD;  Location: [facility] ORS;  Service: Urology;  Laterality: Bilateral;   PERINEAL PROSTATECTOMY     pointer finger surgery Left 05/2019   POLYPECTOMY     TONSILLECTOMY      Allergies: Allergies as of 02/24/2024 - Review Complete 02/24/2024  Allergen Reaction Noted   Latex Itching 08/01/2015    Medications: Outpatient  Encounter Medications as of 02/24/2024  Medication Sig   acetaminophen  (TYLENOL ) 500 MG tablet Take 500 mg by mouth every 6 (six) hours as needed.   albuterol (VENTOLIN HFA) 108 (90 Base) MCG/ACT inhaler Inhale 2 puffs into the lungs.   amLODipine (NORVASC) 5 MG tablet Take 5 mg by mouth 2 (two) times daily.   atorvastatin (LIPITOR) 40 MG tablet Take 40 mg by mouth daily.   Cholecalciferol (VITAMIN D-3) 25 MCG (1000 UT) CAPS Take by mouth.   Choline Fenofibrate 135 MG capsule Take 135 mg by mouth daily.   cyanocobalamin (VITAMIN B12) 100 MCG tablet Take 100 mcg by mouth daily.   ELIQUIS  2.5 MG TABS tablet Take 2.5 mg by mouth 2 (two) times daily.   flecainide (TAMBOCOR) 50 MG tablet Take 50 mg by mouth 2 (two) times daily.   Magnesium 250 MG TABS Take 250 mg by mouth daily.   metoprolol  tartrate (LOPRESSOR ) 25 MG tablet Take 0.5 tablets (12.5 mg total) by mouth 2 (two) times daily.   meloxicam (  MOBIC) 7.5 MG tablet Take 7.5 mg by mouth 2 (two) times daily.   No facility-administered encounter medications on file as of 02/24/2024.    Social History: Social History   Tobacco Use   Smoking status: Former    Current packs/day: 0.00    Average packs/day: 2.0 packs/day for 30.0 years (60.0 ttl pk-yrs)    Types: Cigarettes    Start date: 07/20/1957    Quit date: 07/21/1987    Years since quitting: 36.6    Passive exposure: Past   Smokeless tobacco: Never  Vaping Use   Vaping status: Never Used  Substance Use Topics   Alcohol use: Yes    Alcohol/week: 1.0 standard drink of alcohol    Types: 1 Glasses of wine per week    Comment: 1-2 drinks per day   Drug use: No    Family Medical History: History reviewed. No pertinent family history.  Exam: Today's Vitals   02/24/24 0844  BP: 122/66  Weight: 90.7 kg  Height: 6' (1.829 m)  PainSc: 5   PainLoc: Leg   Body mass index is 27.12 kg/m.   General: A&O.  ROM of spine: some impaired flexion and extension.  Palpation of spine: non TTP.   Strength in the left lower extremity is EHL 5/5, Dorsiflexion 5/5, Plantar flexion 5/5, Hamstring 5/5, Quadricep 5/5, Iliopsoas 4+/5. Strength in the right lower extremity is EHL 5/5, Dorsiflexion 5/5, Plantar flexion 5/5, Hamstring 5/5, Quadricep 5/5, Iliopsoas 5/5. Reflexes are 1+ and symmetric at the patella and achilles.   Bilateral lower extremity sensation is intact to light touch.  Clonus is negative.  Toes down-going.  Gait is mildly antalgic and flexed  Imaging: 01/18/22 MRI lumbar spine FINDINGS: Segmentation:  Standard   Alignment:  Physiologic.   Vertebrae:  Unchanged height loss at L2.  No acute abnormality.   Conus medullaris and cauda equina: Conus extends to the L1 level. Conus and cauda equina appear normal.   Paraspinal and other soft tissues: Negative   Disc levels:   L1-L2: Normal disc space and facet joints. No spinal canal stenosis. No neural foraminal stenosis.   L2-L3: Small disc bulge and mild facet hypertrophy. No spinal canal stenosis. No neural foraminal stenosis.   L3-L4: Postsurgical change with resection of prior right subarticular herniation. No spinal canal stenosis. No neural foraminal stenosis.   L4-L5: Moderate facet hypertrophy and medium-sized disc bulge with endplate spurring. Narrowing of both lateral recesses without central spinal canal stenosis. Unchanged moderate bilateral neural foraminal stenosis.   L5-S1: Moderate facet hypertrophy, unchanged. No disc herniation. No spinal canal stenosis. Slightly worsened moderate left neural foraminal stenosis.   Visualized sacrum: Normal.   IMPRESSION: 1. Slight worsening of moderate left L5-S1 neural foraminal stenosis. 2. Unchanged moderate bilateral L4-L5 neural foraminal stenosis. 3. Postsurgical change of resection of right subarticular L3-4 disc herniation without residual spinal canal or neural foraminal stenosis.     Electronically Signed   By: Franky Stanford M.D.   On:  01/20/2022 03:32  Lumbar xrays from [facility] reviewed which shows multilevel degenerative changes through the lumbar spine, mild coronal plane abnormality at L3-4 and L4-5.   Assessment and Plan: Mr. Siek is a pleasant 83 y.o. male with a longstanding history of facet arthropathy and lumbar stenosis that has become more symptomatic over the last 6 months.  His symptoms do seem to be consistent with neurogenic claudication however his MRI is about 83 years old.  I did recommend updated lumbar MRI for further  evaluation.  This is currently ordered by [facility] and is pending scheduling.  He will let us  know once that is completed and bring us  a disc for review.  He has been working very closely with a Systems analyst and is hopeful that this will suffice for physical therapy requirement as this has not significantly improved his symptoms.  If this is acceptable and his imaging shows correlating pathology, recommend follow-up with Dr. Claudene or Dr. Katrina to discuss possible surgical intervention.  He expressed understanding and was in agreement with this plan.  I will contact him after receiving and reviewing his MRI via MyChart.   Edsel Goods PA-C Neurosurgery

## 2024-02-25 ENCOUNTER — Ambulatory Visit
Admission: RE | Admit: 2024-02-25 | Discharge: 2024-02-25 | Disposition: A | Discharge status: Home / Self Care | Source: Ambulatory Visit | Attending: Pulmonary Disease | Admitting: Pulmonary Disease

## 2024-02-25 ENCOUNTER — Other Ambulatory Visit: Payer: Self-pay | Admitting: Pulmonary Disease

## 2024-02-25 DIAGNOSIS — R7989 Other specified abnormal findings of blood chemistry: Secondary | ICD-10-CM | POA: Diagnosis present

## 2024-02-25 DIAGNOSIS — R0789 Other chest pain: Secondary | ICD-10-CM | POA: Insufficient documentation

## 2024-02-25 DIAGNOSIS — R0989 Other specified symptoms and signs involving the circulatory and respiratory systems: Secondary | ICD-10-CM | POA: Insufficient documentation

## 2024-02-25 DIAGNOSIS — R0602 Shortness of breath: Secondary | ICD-10-CM | POA: Diagnosis present

## 2024-02-25 MED ORDER — IOHEXOL 350 MG/ML SOLN
75.0000 mL | Freq: Once | INTRAVENOUS | Status: AC | PRN
  Administered 2024-02-25: 75 mL via INTRAVENOUS

## 2024-02-28 DIAGNOSIS — R29898 Other symptoms and signs involving the musculoskeletal system: Secondary | ICD-10-CM

## 2024-02-28 DIAGNOSIS — M48062 Spinal stenosis, lumbar region with neurogenic claudication: Secondary | ICD-10-CM

## 2024-02-28 DIAGNOSIS — M5431 Sciatica, right side: Secondary | ICD-10-CM

## 2024-02-28 NOTE — Telephone Encounter (Signed)
 Referral placed for John Marks to cosign.

## 2024-04-02 ENCOUNTER — Encounter: Payer: Self-pay | Admitting: Emergency Medicine

## 2024-04-02 ENCOUNTER — Ambulatory Visit: Admission: EM | Admit: 2024-04-02 | Discharge: 2024-04-02 | Disposition: A | Discharge status: Home / Self Care

## 2024-04-02 DIAGNOSIS — U071 COVID-19: Secondary | ICD-10-CM | POA: Diagnosis not present

## 2024-04-02 MED ORDER — PROMETHAZINE-DM 6.25-15 MG/5ML PO SYRP
5.0000 mL | ORAL_SOLUTION | Freq: Four times a day (QID) | ORAL | 0 refills | Status: DC | PRN
Start: 2024-04-02 — End: 2024-05-30

## 2024-04-02 MED ORDER — MOLNUPIRAVIR EUA 200MG CAPSULE
4.0000 | ORAL_CAPSULE | Freq: Two times a day (BID) | ORAL | 0 refills | Status: AC
Start: 2024-04-02 — End: 2024-04-07

## 2024-04-02 NOTE — ED Triage Notes (Signed)
 Patient took home Covid test this morning and results was positive. Patient complains nasal congestion, cough with yellow sputum x 2 days. Patient reports taking sudafed at 7:30 am with no relief.

## 2024-04-02 NOTE — ED Provider Notes (Signed)
 John Marks    CSN: [id number] Arrival date & time: 04/02/24  0944      History   Chief Complaint Chief Complaint  Patient presents with   Cough   Nasal Congestion    HPI John Marks is a 82 y.o. male.   Patient presents for evaluation of nasal congestion, intermittent sinus pressure to the cheeks, sore throat and a productive cough with yellow sputum present for 2 days.  Denies fever, shortness of breath or wheezing worsening baseline.  Has attempted use of Mucinex and Sudafed.  No known sick contacts but did attempt large gathering recently.  Tolerable to food and liquids.  History of interstitial lung disease, pulmonary fibrosis   Past Medical History:  Diagnosis Date   Acid reflux    Aortic atherosclerosis (HCC)    Aortic stenosis 03/15/2020   a.) stress TTE 03/15/2020: mild (MPG 10.8 mmHg; AVA = 1.3cm2)   Arthritis    Ascending aorta dilatation (HCC) 04/16/2020   a.) CT chest 04/16/2020: asc thoracic Ao measured 39 mm   Atrial fibrillation (HCC)    a.) CHA2DS2VASc = 4 (age x2, HTN, vascular disease history);  b.) rate/rhythm maintained on oral flecainide + metoprolol  tartrate; chronically anticoagulated with dose reduced apixaban    Bladder outlet obstruction    CAD (coronary artery disease) 09/10/2022   a.) cCTA 09/10/2022: Ca2+ 39.7 (11th %ile for age/sex/race matched control); < 25% pLAD   Cholelithiasis with choledocholithiasis    Colon polyps    DDD (degenerative disc disease), cervical    Diastolic dysfunction 02/25/2017   a.) TTE 02/25/2017: EF >55%, mild LVH, mild BAE, mild MAC, triv AR/MR/PR, mild TR, G1DD; b.) stress TTE 08/30/2017: EF >55%, triv panval regurg, AoV sclerosis; c.) TTE 03/23/2019: EF >55%, mild LVH, triv panval regurg, G1DD; d.) stress TTE 03/15/2020: EF >55%, triv panval regurg   Diverticulosis    ED (erectile dysfunction) of organic origin    Hydrocele, bilateral    Hyperlipidemia    Hypertension    LBBB (left bundle branch  block)    Long term current use of anticoagulant    a.) apixaban    Lumbar stenosis    a.) s/p lumbar laminectomy in 2008   Nephrolithiasis    Pneumonia    Prostate cancer (HCC)    a.) s/p retropubic prostatectomy 10/2005   Pulmonary fibrosis (HCC)    Tinnitus     Patient Active Problem List   Diagnosis Date Noted   PAF (paroxysmal atrial fibrillation) (HCC) 03/09/2019   Disc disease, degenerative, lumbar or lumbosacral 12/19/2013   Diverticulosis 12/19/2013   HTN (hypertension) 12/19/2013   Hyperlipidemia 12/19/2013   Tinnitus 12/19/2013   Bladder outlet obstruction 12/22/2012   ED (erectile dysfunction) of organic origin 12/22/2012   Malignant neoplasm of prostate (HCC) 12/22/2012   Personal history of prostate cancer 12/22/2012    Past Surgical History:  Procedure Laterality Date   BACK SURGERY     laminectomy lumbar   COLONOSCOPY WITH PROPOFOL  N/A 08/01/2015   Procedure: COLONOSCOPY WITH PROPOFOL ;  Surgeon: Lamar ONEIDA Holmes, MD;  Location: [facility] ENDOSCOPY;  Service: Endoscopy;  Laterality: N/A;   COLONOSCOPY WITH PROPOFOL  N/A 01/22/2021   Procedure: COLONOSCOPY WITH PROPOFOL ;  Surgeon: Toledo, Ladell POUR, MD;  Location: [facility] ENDOSCOPY;  Service: Gastroenterology;  Laterality: N/A;   CYSTOSCOPY/URETEROSCOPY/HOLMIUM LASER/STENT PLACEMENT Right 02/26/2023   Procedure: CYSTOSCOPY/URETEROSCOPY/HOLMIUM LASER/STENT PLACEMENT;  Surgeon: Francisca Redell BROCKS, MD;  Location: [facility] ORS;  Service: Urology;  Laterality: Right;   HYDROCELE EXCISION Bilateral 02/26/2023  Procedure: HYDROCELECTOMY ADULT;  Surgeon: Francisca Redell BROCKS, MD;  Location: [facility] ORS;  Service: Urology;  Laterality: Bilateral;   PERINEAL PROSTATECTOMY     pointer finger surgery Left 05/2019   POLYPECTOMY     TONSILLECTOMY         Home Medications    Prior to Admission medications   Medication Sig Start Date End Date Taking? Authorizing Provider  dexamethasone  (DECADRON ) 4 MG tablet Take by mouth. 03/22/24 04/11/24 Yes  [provider]  molnupiravir  EUA (LAGEVRIO ) 200 mg CAPS capsule Take 4 capsules (800 mg total) by mouth 2 (two) times daily for 5 days. 04/02/24 04/07/24 Yes Elon Eoff R, NP  acetaminophen  (TYLENOL ) 500 MG tablet Take 500 mg by mouth every 6 (six) hours as needed.    [provider]  albuterol (VENTOLIN HFA) 108 (90 Base) MCG/ACT inhaler Inhale 2 puffs into the lungs. 02/23/24 05/23/24  [provider]  amLODipine (NORVASC) 5 MG tablet Take 5 mg by mouth 2 (two) times daily.    [provider]  atorvastatin (LIPITOR) 40 MG tablet Take 40 mg by mouth daily.    [provider]  Cholecalciferol (VITAMIN D-3) 25 MCG (1000 UT) CAPS Take by mouth.    [provider]  Choline Fenofibrate 135 MG capsule Take 135 mg by mouth daily.    [provider]  cyanocobalamin (VITAMIN B12) 100 MCG tablet Take 100 mcg by mouth daily. 10/08/21   [provider]  ELIQUIS  2.5 MG TABS tablet Take 5 mg by mouth 2 (two) times daily. 10/21/22   [provider]  flecainide (TAMBOCOR) 50 MG tablet Take 50 mg by mouth 2 (two) times daily. 03/03/19   [provider]  folic acid (FOLVITE) 1 MG tablet Take 1 mg by mouth daily.    [provider]  Magnesium 250 MG TABS Take 250 mg by mouth daily. 04/18/22   [provider]  meloxicam (MOBIC) 7.5 MG tablet Take 7.5 mg by mouth 2 (two) times daily.    [provider]  methotrexate (RHEUMATREX) 2.5 MG tablet Take 10 mg by mouth once a week.    [provider]  metoprolol  tartrate (LOPRESSOR ) 25 MG tablet Take 0.5 tablets (12.5 mg total) by mouth 2 (two) times daily. 09/14/22 02/24/24  Darliss Redell, MD    Family History History reviewed. No pertinent family history.  Social History Social History   Tobacco Use   Smoking status: Former    Current packs/day: 0.00    Average packs/day: 2.0 packs/day for 30.0 years (60.0 ttl pk-yrs)    Types: Cigarettes     Start date: 07/20/1957    Quit date: 07/21/1987    Years since quitting: 36.7    Passive exposure: Past   Smokeless tobacco: Never  Vaping Use   Vaping status: Never Used  Substance Use Topics   Alcohol use: Yes    Alcohol/week: 1.0 standard drink of alcohol    Types: 1 Glasses of wine per week    Comment: 1-2 drinks per day   Drug use: No     Allergies   Latex   Review of Systems Review of Systems   Physical Exam Triage Vital Signs ED Triage Vitals  Encounter Vitals Group     BP 04/02/24 1029 135/75     Girls Systolic BP Percentile --      Girls Diastolic BP Percentile --      Boys Systolic BP Percentile --      Boys Diastolic  BP Percentile --      Pulse Rate 04/02/24 1029 60     Resp 04/02/24 1029 20     Temp 04/02/24 1029 98.4 F (36.9 C)     Temp Source 04/02/24 1029 Oral     SpO2 04/02/24 1029 98 %     Weight --      Height --      Head Circumference --      Peak Flow --      Pain Score 04/02/24 1025 0     Pain Loc --      Pain Education --      Exclude from Growth Chart --    No data found.  Updated Vital Signs BP 135/75 (BP Location: Left Arm)   Pulse 60   Temp 98.4 F (36.9 C) (Oral)   Resp 20   SpO2 98%   Visual Acuity Right Eye Distance:   Left Eye Distance:   Bilateral Distance:    Right Eye Near:   Left Eye Near:    Bilateral Near:     Physical Exam Constitutional:      Appearance: Normal appearance.  HENT:     Head: Normocephalic.     Right Ear: Tympanic membrane, ear canal and external ear normal.     Left Ear: Tympanic membrane, ear canal and external ear normal.     Nose: Congestion present.     Mouth/Throat:     Pharynx: No oropharyngeal exudate or posterior oropharyngeal erythema.  Eyes:     Extraocular Movements: Extraocular movements intact.  Cardiovascular:     Rate and Rhythm: Normal rate and regular rhythm.     Pulses: Normal pulses.     Heart sounds: Normal heart sounds.  Pulmonary:     Effort: Pulmonary effort  is normal.     Breath sounds: Normal breath sounds.  Musculoskeletal:     Cervical back: Normal range of motion and neck supple.  Neurological:     Mental Status: He is alert and oriented to person, place, and time. Mental status is at baseline.      UC Treatments / Results  Labs (all labs ordered are listed, but only abnormal results are displayed) Labs Reviewed - No data to display  EKG   Radiology No results found.  Procedures Procedures (including critical care time)  Medications Ordered in UC Medications - No data to display  Initial Impression / Assessment and Plan / UC Course  I have reviewed the triage vital signs and the nursing notes.  Pertinent labs & imaging results that were available during my care of the patient were reviewed by me and considered in my medical decision making (see chart for details).  COVID-19  Patient is in no signs of distress nor toxic appearing.  Vital signs are stable.  Low suspicion for pneumonia, pneumothorax or bronchitis and therefore will defer imaging.  Discussed quarantine per the CDC.  Prescribed antiviral, prescribed Lagevrio , deferring use of Paxlovid due to Eliquis .  Declined prescription cough medicine.May use additional over-the-counter medications as needed for supportive care.  May follow-up with urgent care as needed if symptoms persist or worsen.    Final Clinical Impressions(s) / UC Diagnoses   Final diagnoses:  COVID-19     Discharge Instructions      Covid 19 is a virus and should steadily improve in time it can take up to 7 to 10 days before you truly start to see a turnaround however things will get better  Per the CDC you will need to quarantine and to your 24 hours without fever, if no fever may continue activity wearing mask  Begin use of antiviral taking twice daily for 5 days this helps to reduce the amount of virus in the body minimizing your symptoms and helping symptoms to clear more quickly, does  not fully take away your illness  You can take Tylenol  and/or Ibuprofen as needed for fever reduction and pain relief.   For cough: honey 1/2 to 1 teaspoon (you can dilute the honey in water or another fluid).  You can also use guaifenesin and dextromethorphan for cough. You can use a humidifier for chest congestion and cough.  If you don't have a humidifier, you can sit in the bathroom with the hot shower running.      For sore throat: try warm salt water gargles, cepacol lozenges, throat spray, warm tea or water with lemon/honey, popsicles or ice, or OTC cold relief medicine for throat discomfort.   For congestion: take a daily anti-histamine like Zyrtec, Claritin, and a oral decongestant, such as pseudoephedrine.  You can also use Flonase 1-2 sprays in each nostril daily.   It is important to stay hydrated: drink plenty of fluids (water, gatorade/powerade/pedialyte, juices, or teas) to keep your throat moisturized and help further relieve irritation/discomfort.    ED Prescriptions     Medication Sig Dispense Auth. Provider   molnupiravir  EUA (LAGEVRIO ) 200 mg CAPS capsule Take 4 capsules (800 mg total) by mouth 2 (two) times daily for 5 days. 40 capsule Flois Mctague R, NP      PDMP not reviewed this encounter.   Teresa Shelba SAUNDERS, NP 04/02/24 1055

## 2024-04-02 NOTE — Discharge Instructions (Signed)
 Covid 19 is a virus and should steadily improve in time it can take up to 7 to 10 days before you truly start to see a turnaround however things will get better    Per the CDC you will need to quarantine and to your 24 hours without fever, if no fever may continue activity wearing mask  Begin use of antiviral taking twice daily for 5 days this helps to reduce the amount of virus in the body minimizing your symptoms and helping symptoms to clear more quickly, does not fully take away your illness  You can take Tylenol  and/or Ibuprofen as needed for fever reduction and pain relief.   For cough: honey 1/2 to 1 teaspoon (you can dilute the honey in water or another fluid).  You can also use guaifenesin and dextromethorphan for cough. You can use a humidifier for chest congestion and cough.  If you don't have a humidifier, you can sit in the bathroom with the hot shower running.      For sore throat: try warm salt water gargles, cepacol lozenges, throat spray, warm tea or water with lemon/honey, popsicles or ice, or OTC cold relief medicine for throat discomfort.   For congestion: take a daily anti-histamine like Zyrtec, Claritin, and a oral decongestant, such as pseudoephedrine.  You can also use Flonase 1-2 sprays in each nostril daily.   It is important to stay hydrated: drink plenty of fluids (water, gatorade/powerade/pedialyte, juices, or teas) to keep your throat moisturized and help further relieve irritation/discomfort.

## 2024-04-03 ENCOUNTER — Ambulatory Visit

## 2024-04-27 ENCOUNTER — Ambulatory Visit (INDEPENDENT_AMBULATORY_CARE_PROVIDER_SITE_OTHER): Admitting: Neurosurgery

## 2024-04-27 ENCOUNTER — Encounter: Payer: Self-pay | Admitting: Neurosurgery

## 2024-04-27 VITALS — BP 110/76 | Wt 202.2 lb

## 2024-04-27 DIAGNOSIS — M48062 Spinal stenosis, lumbar region with neurogenic claudication: Secondary | ICD-10-CM | POA: Diagnosis not present

## 2024-04-27 DIAGNOSIS — M5431 Sciatica, right side: Secondary | ICD-10-CM

## 2024-04-27 DIAGNOSIS — M719 Bursopathy, unspecified: Secondary | ICD-10-CM | POA: Diagnosis not present

## 2024-04-27 DIAGNOSIS — R29898 Other symptoms and signs involving the musculoskeletal system: Secondary | ICD-10-CM

## 2024-04-27 NOTE — Progress Notes (Signed)
 Follow-up note: Referring Physician:  Auston Reyes BIRCH, MD [address] [facility] [place],  [place] [address]  Primary Physician:  Auston Reyes BIRCH, MD  Chief Complaint:  lumbar symptoms   History of Present Illness: 04/27/24 [name] presents today for follow up regarding ongoing leg pain. He completed PT at [facility]. He states this was helping until a couple of weeks ago when he went on a fishing trip and has had increased bilateral leg pain since. He also endorses some right lateral thigh and hip pain that does not extend into the groin. This is new. He continues to be without significant back pain.He completed a course of PT but is continuing in hopes of getting through his most recent flare.   02/24/24 [place] John Marks is a 83 y.o. male who presents with the chief complaint of bilateral leg pain. This started about 6 months ago largely after his hip replacement which she had in December 2024 however, he does not know any specific inciting event.  He states that he started with a personal trainer as he felt like he was having weakness in his lower extremities however his symptoms did not improve.  He was seen by someone at [facility] in June and xrays were obtained. He was told he had lumbar stenosis and they recommended and MRI of his lumbar spine which is pending.  Today he reports about 6 months of intermittent bilateral sciatic type buttock and thigh pain with a sensation of weakness/ tiredness in his bilateral calves. This is worse with walking for about 10 minutes and improves with sitting.  He denies any significant low back pain.  His symptoms are significantly impacting his quality of life. He has not had any recent lumbar injections although he is hesitant to do this as he had injections in his hip prior to his hip replacement and this delayed his ability to obtain a hip replacement.  LOV  01/13/2022 [name] is a 83 y.o with a history of HLD, CAD, GERD,  prostate CA, ILD followed up pulmonology, who is here today with a chief complaint of pain in his calves and hips with ambulating.  He states that he has been having this pain for the last 6 months without any particular inciting event. He does scribes it as a shooting pain that starts in his hips bilaterally and radiates to his calves. He states that the pain is similar on the left compared to the right. He also endorses a constant low back pain but this has been present since his surgery in 2008 and largely minor. He states that the pain in his legs occurs when he is ambulating for more than about 100 yards and resolves within a few minutes of rest. While this is not as severe as the pain he had prior to his laminectomy in 2008 he states that it is similar prompting concern for further lumbar stenosis. He is currently taking Tylenol  as needed which does provide some relief and has not undergone any conservative treatment with physical therapy or injections. While he does endorse some urge type urinary incontinence, he denies any perineal numbness or bowel incontinence. He denies any numbness or weakness in his lower extremities.  Conservative measures:  Physical therapy: none recently Multimodal medical therapy including regular antiinflammatories: Tylenol  with some relief Injections: no recent epidural steroid injections  Past Surgery: lumbar laminectomy in 2008 with Dr. Kathi Norleen GORMAN Pearline has no symptoms of cervical myelopathy.  The symptoms are causing a significant impact on the patient's life.   Review of Systems:  A 10 point review of systems is negative,  and the pertinent positives and negatives detailed in the HPI.  Past Medical History: Past Medical History:  Diagnosis Date   Acid reflux    Aortic atherosclerosis    Aortic stenosis 03/15/2020   a.) stress TTE 03/15/2020: mild (MPG 10.8 mmHg; AVA = 1.3cm2)   Arthritis    Ascending aorta dilatation 04/16/2020   a.) CT chest  04/16/2020: asc thoracic Ao measured 39 mm   Atrial fibrillation (HCC)    a.) CHA2DS2VASc = 4 (age x2, HTN, vascular disease history);  b.) rate/rhythm maintained on oral flecainide + metoprolol  tartrate; chronically anticoagulated with dose reduced apixaban    Bladder outlet obstruction    CAD (coronary artery disease) 09/10/2022   a.) cCTA 09/10/2022: Ca2+ 39.7 (11th %ile for age/sex/race matched control); < 25% pLAD   Cholelithiasis with choledocholithiasis    Colon polyps    DDD (degenerative disc disease), cervical    Diastolic dysfunction 02/25/2017   a.) TTE 02/25/2017: EF >55%, mild LVH, mild BAE, mild MAC, triv AR/MR/PR, mild TR, G1DD; b.) stress TTE 08/30/2017: EF >55%, triv panval regurg, AoV sclerosis; c.) TTE 03/23/2019: EF >55%, mild LVH, triv panval regurg, G1DD; d.) stress TTE 03/15/2020: EF >55%, triv panval regurg   Diverticulosis    ED (erectile dysfunction) of organic origin    Hydrocele, bilateral    Hyperlipidemia    Hypertension    LBBB (left bundle branch block)    Long term current use of anticoagulant    a.) apixaban    Lumbar stenosis    a.) s/p lumbar laminectomy in 2008   Nephrolithiasis    Pneumonia    Prostate cancer (HCC)    a.) s/p retropubic prostatectomy 10/2005   Pulmonary fibrosis (HCC)    Tinnitus     Past Surgical History: Past Surgical History:  Procedure Laterality Date   BACK SURGERY     laminectomy lumbar   COLONOSCOPY WITH PROPOFOL  N/A 08/01/2015   Procedure: COLONOSCOPY WITH PROPOFOL ;  Surgeon: Lamar ONEIDA Holmes, MD;  Location: [facility] ENDOSCOPY;  Service: Endoscopy;  Laterality: N/A;   COLONOSCOPY WITH PROPOFOL  N/A 01/22/2021   Procedure: COLONOSCOPY WITH PROPOFOL ;  Surgeon: Toledo, Ladell POUR, MD;  Location: [facility] ENDOSCOPY;  Service: Gastroenterology;  Laterality: N/A;   CYSTOSCOPY/URETEROSCOPY/HOLMIUM LASER/STENT PLACEMENT Right 02/26/2023   Procedure: CYSTOSCOPY/URETEROSCOPY/HOLMIUM LASER/STENT PLACEMENT;  Surgeon: Francisca Redell BROCKS, MD;   Location: [facility] ORS;  Service: Urology;  Laterality: Right;   HYDROCELE EXCISION Bilateral 02/26/2023   Procedure: HYDROCELECTOMY ADULT;  Surgeon: Francisca Redell BROCKS, MD;  Location: [facility] ORS;  Service: Urology;  Laterality: Bilateral;   PERINEAL PROSTATECTOMY     pointer finger surgery Left 05/2019   POLYPECTOMY     TONSILLECTOMY      Allergies: Allergies as of 04/27/2024 - Review Complete 04/27/2024  Allergen Reaction Noted   Latex Itching 08/01/2015    Medications: Outpatient Encounter Medications as of 04/27/2024  Medication Sig   acetaminophen  (TYLENOL ) 500 MG tablet Take 500 mg by mouth every 6 (six) hours as needed.   albuterol (VENTOLIN HFA) 108 (90 Base) MCG/ACT inhaler Inhale 2 puffs into the lungs.   amLODipine (NORVASC) 5 MG tablet Take 5 mg by mouth 2 (two) times daily.   atorvastatin (LIPITOR) 40 MG tablet Take 40 mg by mouth daily.   Cholecalciferol (VITAMIN D-3) 25 MCG (1000 UT) CAPS Take by mouth.   Choline Fenofibrate 135  MG capsule Take 135 mg by mouth daily.   cyanocobalamin (VITAMIN B12) 100 MCG tablet Take 100 mcg by mouth daily.   ELIQUIS  2.5 MG TABS tablet Take 5 mg by mouth 2 (two) times daily.   flecainide (TAMBOCOR) 50 MG tablet Take 50 mg by mouth 2 (two) times daily.   folic acid (FOLVITE) 1 MG tablet Take 1 mg by mouth daily.   Magnesium 250 MG TABS Take 250 mg by mouth daily.   meloxicam (MOBIC) 7.5 MG tablet Take 7.5 mg by mouth 2 (two) times daily.   methotrexate (RHEUMATREX) 2.5 MG tablet Take 10 mg by mouth once a week.   metoprolol  tartrate (LOPRESSOR ) 25 MG tablet Take 0.5 tablets (12.5 mg total) by mouth 2 (two) times daily.   promethazine -dextromethorphan (PROMETHAZINE -DM) 6.25-15 MG/5ML syrup Take 5 mLs by mouth 4 (four) times daily as needed.   No facility-administered encounter medications on file as of 04/27/2024.    Social History: Social History   Tobacco Use   Smoking status: Former    Current packs/day: 0.00    Average packs/day: 2.0  packs/day for 30.0 years (60.0 ttl pk-yrs)    Types: Cigarettes    Start date: 07/20/1957    Quit date: 07/21/1987    Years since quitting: 36.7    Passive exposure: Past   Smokeless tobacco: Never  Vaping Use   Vaping status: Never Used  Substance Use Topics   Alcohol use: Yes    Alcohol/week: 1.0 standard drink of alcohol    Types: 1 Glasses of wine per week    Comment: 1-2 drinks per day   Drug use: No    Family Medical History: No family history on file.  Exam: Today's Vitals   04/27/24 1029  BP: 110/76  Weight: 202 lb 3.2 oz (91.7 kg)  PainSc: 5   PainLoc: Leg    Body mass index is 27.42 kg/m.   General: A&O.  ROM of spine: some impaired flexion and extension.  Palpation of spine: non TTP.  Strength in the left lower extremity is EHL 5/5, Dorsiflexion 5/5, Plantar flexion 5/5, Hamstring 5/5, Quadricep 5/5, Iliopsoas 5/5. Strength in the right lower extremity is EHL 5/5, Dorsiflexion 5/5, Plantar flexion 5/5, Hamstring 5/5, Quadricep 5/5, Iliopsoas 5/5. Reflexes are 1+ and symmetric at the patella and achilles.   Bilateral lower extremity sensation is intact to light touch.  Clonus is negative.  Toes down-going.  Gait is mildly antalgic and flexed  MSK: TTP of right greater trochanter   Imaging: 01/18/22 MRI lumbar spine FINDINGS: Segmentation:  Standard   Alignment:  Physiologic.   Vertebrae:  Unchanged height loss at L2.  No acute abnormality.   Conus medullaris and cauda equina: Conus extends to the L1 level. Conus and cauda equina appear normal.   Paraspinal and other soft tissues: Negative   Disc levels:   L1-L2: Normal disc space and facet joints. No spinal canal stenosis. No neural foraminal stenosis.   L2-L3: Small disc bulge and mild facet hypertrophy. No spinal canal stenosis. No neural foraminal stenosis.   L3-L4: Postsurgical change with resection of prior right subarticular herniation. No spinal canal stenosis. No neural foraminal stenosis.    L4-L5: Moderate facet hypertrophy and medium-sized disc bulge with endplate spurring. Narrowing of both lateral recesses without central spinal canal stenosis. Unchanged moderate bilateral neural foraminal stenosis.   L5-S1: Moderate facet hypertrophy, unchanged. No disc herniation. No spinal canal stenosis. Slightly worsened moderate left neural foraminal stenosis.   Visualized sacrum: Normal.  IMPRESSION: 1. Slight worsening of moderate left L5-S1 neural foraminal stenosis. 2. Unchanged moderate bilateral L4-L5 neural foraminal stenosis. 3. Postsurgical change of resection of right subarticular L3-4 disc herniation without residual spinal canal or neural foraminal stenosis.     Electronically Signed   By: Franky Stanford M.D.   On: 01/20/2022 03:32  Lumbar xrays from [facility] reviewed which shows multilevel degenerative changes through the lumbar spine, mild coronal plane abnormality at L3-4 and L4-5.   Assessment and Plan: [name] is a pleasant 83 y.o. male with a longstanding history of facet arthropathy and lumbar stenosis that has become more symptomatic over the last 8 months.  His symptoms do seem to be consistent with neurogenic claudication. He also has symptoms consistent with right sided bursitis. We briefly discussed options for medication management of his symptoms however he states he has had 2 courses of steroids this year, one for a shoulder injury and one in early September for his underlying interstitial lung disease and would like to avoid this. Given his underlying cardiac history, I would also like to avoid NSAIDs. He will continue with Tylenol  for now and we will escalate as needed. In the meantime we will work towards obtaining an updated lumbar MRI for further evaluation. He was agreeable to me sending him a mychart message with the results and to discuss further plan of care which would include possible referral for injections vs surgical discussion with  Dr. Clois or Dr. Claudene depending on his MRI results. He understands that he would likely need both cardiac and pulmonary clearance before undergoing any surgical intervention given his history.  He was encouraged to contact the office in there interim with any questions or concerns. He expressed understanding and was in agreement with this plan.   Edsel Goods PA-C Neurosurgery

## 2024-05-04 ENCOUNTER — Ambulatory Visit
Admission: RE | Admit: 2024-05-04 | Discharge: 2024-05-04 | Disposition: A | Discharge status: Home / Self Care | Source: Ambulatory Visit | Attending: Neurosurgery | Admitting: Neurosurgery

## 2024-05-04 DIAGNOSIS — M48062 Spinal stenosis, lumbar region with neurogenic claudication: Secondary | ICD-10-CM | POA: Insufficient documentation

## 2024-05-04 DIAGNOSIS — R29898 Other symptoms and signs involving the musculoskeletal system: Secondary | ICD-10-CM | POA: Insufficient documentation

## 2024-05-04 DIAGNOSIS — M5432 Sciatica, left side: Secondary | ICD-10-CM | POA: Insufficient documentation

## 2024-05-04 DIAGNOSIS — M5431 Sciatica, right side: Secondary | ICD-10-CM | POA: Diagnosis present

## 2024-05-08 ENCOUNTER — Telehealth: Payer: Self-pay | Admitting: Neurosurgery

## 2024-05-08 NOTE — Telephone Encounter (Signed)
 Please forward message to her once you talk to him so she contact him.

## 2024-05-08 NOTE — Telephone Encounter (Signed)
 Pt called in to speak with to Danielle in regard to his reoccurring pain. Stated that they wanted to have someone to call him back to discuss this, I offered to schedule an appointment with Edsel but he preferred a call instead from her.

## 2024-05-08 NOTE — Telephone Encounter (Signed)
 Looks like John Marks was going to review his lumbar MRI and then send him the results.   Let him know she is out of the office and will be back tomorrow.   Let me know if he needs anything else.

## 2024-05-09 NOTE — Telephone Encounter (Signed)
 Patient states he is wanting to discuss MRI result. No other things to discuss.

## 2024-05-25 NOTE — Progress Notes (Signed)
 Referring Physician:  Auston Reyes BIRCH, MD [address] [facility] [place],  [place] [address]  Primary Physician:  John Reyes BIRCH, MD  History of Present Illness: 05/30/2024 [name] is here today with a chief complaint of back and leg pain.  He has both back and leg pain when he stands and tries to walk.  He can only walk for a few minutes before he has to sit down.  A year ago, he could walk a full 18 hole course.  In the spring, he could still do 9 holes.  He can no longer do that.  He has tried physical therapy without improvement.  He has tried medications without improvements.  He is here today to discuss options.   John Marks has no symptoms of cervical myelopathy.  The symptoms are causing a significant impact on the patient's life.   I have utilized the care everywhere function in epic to review the outside records available from external health systems.  Progress Note from John Marks, John Marks on 04/27/24:  History of Present Illness: 04/27/24 [name] presents today for follow up regarding ongoing leg pain. He completed PT at [facility]. He states this was helping until a couple of weeks ago when he went on a fishing trip and has had increased bilateral leg pain since. He also endorses some right lateral thigh and hip pain that does not extend into the groin. This is new. He continues to be without significant back pain.He completed a course of PT but is continuing in hopes of getting through his most recent flare.    02/24/24 John Marks is a 83 y.o. male who presents with the chief complaint of bilateral leg pain. This started about 6 months ago largely after his hip replacement which she had in December 2024 however, he does not know any specific inciting event.  He states that he started with a personal trainer as he felt like he was having weakness in his lower extremities however his symptoms did not improve.  He was seen by someone at  [facility] in June and xrays were obtained. He was told he had lumbar stenosis and they recommended and MRI of his lumbar spine which is pending.  Today he reports about 6 months of intermittent bilateral sciatic type buttock and thigh pain with a sensation of weakness/ tiredness in his bilateral calves. This is worse with walking for about 10 minutes and improves with sitting.  He denies any significant low back pain.  His symptoms are significantly impacting his quality of life. He has not had any recent lumbar injections although he is hesitant to do this as he had injections in his hip prior to his hip replacement and this delayed his ability to obtain a hip replacement.   LOV  01/13/2022 [name] is a 83 y.o with a history of HLD, CAD, GERD, prostate CA, ILD followed up pulmonology, who is here today with a chief complaint of pain in his calves and hips with ambulating.  He states that he has been having this pain for the last 6 months without any particular inciting event. He does scribes it as a shooting pain that starts in his hips bilaterally and radiates to his calves. He states that the pain is similar on the left compared to the right. He also endorses a constant low back pain but this has been present since his surgery in 2008 and largely minor. He states that  the pain in his legs occurs when he is ambulating for more than about 100 yards and resolves within a few minutes of rest. While this is not as severe as the pain he had prior to his laminectomy in 2008 he states that it is similar prompting concern for further lumbar stenosis. He is currently taking Tylenol  as needed which does provide some relief and has not undergone any conservative treatment with physical therapy or injections. While he does endorse some urge type urinary incontinence, he denies any perineal numbness or bowel incontinence. He denies any numbness or weakness in his lower extremities.  Conservative measures:   Physical therapy: none recently Multimodal medical therapy including regular antiinflammatories: Tylenol  with some relief Injections: no recent epidural steroid injections  Past Surgery: lumbar laminectomy in 2008 with Dr. Kathi   Review of Systems:  A 10 point review of systems is negative, except for the pertinent positives and negatives detailed in the HPI.  Past Medical History: Past Medical History:  Diagnosis Date   Acid reflux    Aortic atherosclerosis    Aortic stenosis 03/15/2020   a.) stress TTE 03/15/2020: mild (MPG 10.8 mmHg; AVA = 1.3cm2)   Arthritis    Ascending aorta dilatation 04/16/2020   a.) CT chest 04/16/2020: asc thoracic Ao measured 39 mm   Atrial fibrillation (HCC)    a.) CHA2DS2VASc = 4 (age x2, HTN, vascular disease history);  b.) rate/rhythm maintained on oral flecainide + metoprolol  tartrate; chronically anticoagulated with dose reduced apixaban    Bladder outlet obstruction    CAD (coronary artery disease) 09/10/2022   a.) cCTA 09/10/2022: Ca2+ 39.7 (11th %ile for age/sex/race matched control); < 25% pLAD   Cholelithiasis with choledocholithiasis    Colon polyps    DDD (degenerative disc disease), cervical    Diastolic dysfunction 02/25/2017   a.) TTE 02/25/2017: EF >55%, mild LVH, mild BAE, mild MAC, triv AR/MR/PR, mild TR, G1DD; b.) stress TTE 08/30/2017: EF >55%, triv panval regurg, AoV sclerosis; c.) TTE 03/23/2019: EF >55%, mild LVH, triv panval regurg, G1DD; d.) stress TTE 03/15/2020: EF >55%, triv panval regurg   Diverticulosis    ED (erectile dysfunction) of organic origin    Hydrocele, bilateral    Hyperlipidemia    Hypertension    LBBB (left bundle branch block)    Long term current use of anticoagulant    a.) apixaban    Lumbar stenosis    a.) s/p lumbar laminectomy in 2008   Nephrolithiasis    Pneumonia    Prostate cancer (HCC)    a.) s/p retropubic prostatectomy 10/2005   Pulmonary fibrosis (HCC)    Tinnitus     Past Surgical  History: Past Surgical History:  Procedure Laterality Date   BACK SURGERY     laminectomy lumbar   COLONOSCOPY WITH PROPOFOL  N/A 08/01/2015   Procedure: COLONOSCOPY WITH PROPOFOL ;  Surgeon: Lamar ONEIDA Holmes, MD;  Location: [facility] ENDOSCOPY;  Service: Endoscopy;  Laterality: N/A;   COLONOSCOPY WITH PROPOFOL  N/A 01/22/2021   Procedure: COLONOSCOPY WITH PROPOFOL ;  Surgeon: Toledo, Ladell POUR, MD;  Location: [facility] ENDOSCOPY;  Service: Gastroenterology;  Laterality: N/A;   CYSTOSCOPY/URETEROSCOPY/HOLMIUM LASER/STENT PLACEMENT Right 02/26/2023   Procedure: CYSTOSCOPY/URETEROSCOPY/HOLMIUM LASER/STENT PLACEMENT;  Surgeon: Francisca Redell BROCKS, MD;  Location: [facility] ORS;  Service: Urology;  Laterality: Right;   HYDROCELE EXCISION Bilateral 02/26/2023   Procedure: HYDROCELECTOMY ADULT;  Surgeon: Francisca Redell BROCKS, MD;  Location: [facility] ORS;  Service: Urology;  Laterality: Bilateral;   PERINEAL PROSTATECTOMY     pointer finger surgery  Left 05/2019   POLYPECTOMY     TONSILLECTOMY      Allergies: Allergies as of 05/30/2024 - Review Complete 05/30/2024  Allergen Reaction Noted   Latex Itching 08/01/2015    Medications:  Current Outpatient Medications:    acetaminophen  (TYLENOL ) 500 MG tablet, Take 500 mg by mouth every 6 (six) hours as needed., Disp: , Rfl:    amLODipine (NORVASC) 5 MG tablet, Take 5 mg by mouth 2 (two) times daily., Disp: , Rfl:    atorvastatin (LIPITOR) 40 MG tablet, Take 40 mg by mouth daily., Disp: , Rfl:    Cholecalciferol (VITAMIN D-3) 25 MCG (1000 UT) CAPS, Take by mouth., Disp: , Rfl:    Choline Fenofibrate 135 MG capsule, Take 135 mg by mouth daily., Disp: , Rfl:    cyanocobalamin (VITAMIN B12) 100 MCG tablet, Take 100 mcg by mouth daily., Disp: , Rfl:    ELIQUIS  2.5 MG TABS tablet, Take 5 mg by mouth 2 (two) times daily., Disp: , Rfl:    flecainide (TAMBOCOR) 50 MG tablet, Take 50 mg by mouth 2 (two) times daily., Disp: , Rfl:    folic acid (FOLVITE) 1 MG tablet, Take 1 mg by mouth  daily., Disp: , Rfl:    Magnesium 250 MG TABS, Take 250 mg by mouth daily., Disp: , Rfl:    meloxicam (MOBIC) 7.5 MG tablet, Take 7.5 mg by mouth 2 (two) times daily., Disp: , Rfl:    methotrexate (RHEUMATREX) 2.5 MG tablet, Take 10 mg by mouth once a week., Disp: , Rfl:    metoprolol  tartrate (LOPRESSOR ) 25 MG tablet, Take 0.5 tablets (12.5 mg total) by mouth 2 (two) times daily., Disp: 90 tablet, Rfl: 3   VENTOLIN HFA 108 (90 Base) MCG/ACT inhaler, SMARTSIG:2 inhalation Via Inhaler Every 6 Hours PRN, Disp: , Rfl:   Social History: Social History   Tobacco Use   Smoking status: Former    Current packs/day: 0.00    Average packs/day: 2.0 packs/day for 30.0 years (60.0 ttl pk-yrs)    Types: Cigarettes    Start date: 07/20/1957    Quit date: 07/21/1987    Years since quitting: 36.8    Passive exposure: Past   Smokeless tobacco: Never  Vaping Use   Vaping status: Never Used  Substance Use Topics   Alcohol use: Yes    Alcohol/week: 1.0 standard drink of alcohol    Types: 1 Glasses of wine per week    Comment: 1-2 drinks per day   Drug use: No    Family Medical History: No family history on file.  Physical Examination: Vitals:   05/30/24 0758  BP: 118/76    General: Patient is in no apparent distress. Attention to examination is appropriate.  Neck:   Supple.  Full range of motion.  Respiratory: Patient is breathing without any difficulty.   NEUROLOGICAL:     Awake, alert, oriented to person, place, and time.  Speech is clear and fluent.   Cranial Nerves: Pupils equal round and reactive to light.  Facial tone is symmetric.  Facial sensation is symmetric. Shoulder shrug is symmetric. Tongue protrusion is midline.  There is no pronator drift.  Strength: Side Biceps Triceps Deltoid Interossei Grip Wrist Ext. Wrist Flex.  R 5 5 5 5 5 5 5   L 5 5 5 5 5 5 5    Side Iliopsoas Quads Hamstring PF DF EHL  R 5 5 5 5 5 5   L 5 5 5 5 5 5    Reflexes  are 1+ and symmetric at the  biceps, triceps, brachioradialis, patella and achilles.   Hoffman's is absent.   Bilateral upper and lower extremity sensation is intact to light touch.    No evidence of dysmetria noted.  Gait is antalgic.     Medical Decision Making  Imaging: MR L spine 05/04/2024 IMPRESSION: 1. Multilevel degenerative changes of the lumbar spine are not significantly progressed from prior study. 2. Mild canal stenosis at L2-3 and L4-5. 3. Severe foraminal stenosis on the right at L4-5 and on the left at L5-S1. Moderate-severe right foraminal stenosis at L5-S1. 4. Reactive subchondral marrow signal changes associated with the L5-S1 facet joints, more pronounced on the right. Small right L5-S1 facet joint effusion. This may be a focal source of pain.     Electronically Signed   By: Mabel Converse D.O.   On: 05/06/2024 14:50  I have personally reviewed the images and agree with the above interpretation.  Assessment and Plan: John Marks is a pleasant 83 y.o. male with low back pain with bilateral sciatica.  He has severe lumbar spondylosis at L4-5 and L5-S1.  He has a vacuum disc phenomenon at L4-5 with significant disc height collapse.  At L5-S1, he has severe facet arthrosis.  This has resulted in some moderate to severe on the right and severe left foraminal stenosis at L5-S1 with severe foraminal stenosis on the right at L4-5.  I do not think that adequate decompression L5-S1 could be achieved without bilateral facetectomy and iatrogenic stabilization.  As such, I recommended L5-S1 bilateral facetectomy with instrumentation.  At L4-5, he has severe disc height collapse with foraminal compression of the right L4 nerve root.  I do not think direct decompression will adequately decompress this nerve root.  As such, I recommended L4-5 lateral lumbar interbody fusion.  I have recommended L4-5 lateral lumbar interbody fusion with posterior fixation from L4-S1 with bilateral facetectomy at L5-S1.  I  think this will directly address both of his issues.  I discussed the planned procedure at length with the patient, including the risks, benefits, alternatives, and indications. The risks discussed include but are not limited to bleeding, infection, need for reoperation, spinal fluid leak, stroke, vision loss, anesthetic complication, coma, paralysis, and even death. I also described the possibility of psoas weakness and paresthesias. I described in detail that improvement was not guaranteed.  The patient expressed understanding of these risks, and asked that we proceed with surgery. I described the surgery in layman's terms, and gave ample opportunity for questions, which were answered to the best of my ability.  I spent a total of 30 minutes in this patient's care today. This time was spent reviewing pertinent records including imaging studies, obtaining and confirming history, performing a directed evaluation, formulating and discussing my recommendations, and documenting the visit within the medical record.       Thank you for involving me in the care of this patient.      Loretha Ure K. Clois MD, [facility]

## 2024-05-25 NOTE — H&P (View-Only) (Signed)
 Referring Physician:  Auston Reyes BIRCH, MD [address] [facility] [place],  [place] [address]  Primary Physician:  Auston Reyes BIRCH, MD  History of Present Illness: 05/30/2024 [name] is here today with a chief complaint of back and leg pain.  He has both back and leg pain when he stands and tries to walk.  He can only walk for a few minutes before he has to sit down.  A year ago, he could walk a full 18 hole course.  In the spring, he could still do 9 holes.  He can no longer do that.  He has tried physical therapy without improvement.  He has tried medications without improvements.  He is here today to discuss options.   [name] has no symptoms of cervical myelopathy.  The symptoms are causing a significant impact on the patient's life.   I have utilized the care everywhere function in epic to review the outside records available from external health systems.  Progress Note from Edsel Goods, [place] on 04/27/24:  History of Present Illness: 04/27/24 [name] presents today for follow up regarding ongoing leg pain. He completed PT at [facility]. He states this was helping until a couple of weeks ago when he went on a fishing trip and has had increased bilateral leg pain since. He also endorses some right lateral thigh and hip pain that does not extend into the groin. This is new. He continues to be without significant back pain.He completed a course of PT but is continuing in hopes of getting through his most recent flare.    02/24/24 John Marks is a 83 y.o. male who presents with the chief complaint of bilateral leg pain. This started about 6 months ago largely after his hip replacement which she had in December 2024 however, he does not know any specific inciting event.  He states that he started with a personal trainer as he felt like he was having weakness in his lower extremities however his symptoms did not improve.  He was seen by someone at  [facility] in June and xrays were obtained. He was told he had lumbar stenosis and they recommended and MRI of his lumbar spine which is pending.  Today he reports about 6 months of intermittent bilateral sciatic type buttock and thigh pain with a sensation of weakness/ tiredness in his bilateral calves. This is worse with walking for about 10 minutes and improves with sitting.  He denies any significant low back pain.  His symptoms are significantly impacting his quality of life. He has not had any recent lumbar injections although he is hesitant to do this as he had injections in his hip prior to his hip replacement and this delayed his ability to obtain a hip replacement.   LOV  01/13/2022 [name] is a 83 y.o with a history of HLD, CAD, GERD, prostate CA, ILD followed up pulmonology, who is here today with a chief complaint of pain in his calves and hips with ambulating.  He states that he has been having this pain for the last 6 months without any particular inciting event. He does scribes it as a shooting pain that starts in his hips bilaterally and radiates to his calves. He states that the pain is similar on the left compared to the right. He also endorses a constant low back pain but this has been present since his surgery in 2008 and largely minor. He states that  the pain in his legs occurs when he is ambulating for more than about 100 yards and resolves within a few minutes of rest. While this is not as severe as the pain he had prior to his laminectomy in 2008 he states that it is similar prompting concern for further lumbar stenosis. He is currently taking Tylenol  as needed which does provide some relief and has not undergone any conservative treatment with physical therapy or injections. While he does endorse some urge type urinary incontinence, he denies any perineal numbness or bowel incontinence. He denies any numbness or weakness in his lower extremities.  Conservative measures:   Physical therapy: none recently Multimodal medical therapy including regular antiinflammatories: Tylenol  with some relief Injections: no recent epidural steroid injections  Past Surgery: lumbar laminectomy in 2008 with Dr. Kathi   Review of Systems:  A 10 point review of systems is negative, except for the pertinent positives and negatives detailed in the HPI.  Past Medical History: Past Medical History:  Diagnosis Date   Acid reflux    Aortic atherosclerosis    Aortic stenosis 03/15/2020   a.) stress TTE 03/15/2020: mild (MPG 10.8 mmHg; AVA = 1.3cm2)   Arthritis    Ascending aorta dilatation 04/16/2020   a.) CT chest 04/16/2020: asc thoracic Ao measured 39 mm   Atrial fibrillation (HCC)    a.) CHA2DS2VASc = 4 (age x2, HTN, vascular disease history);  b.) rate/rhythm maintained on oral flecainide + metoprolol  tartrate; chronically anticoagulated with dose reduced apixaban    Bladder outlet obstruction    CAD (coronary artery disease) 09/10/2022   a.) cCTA 09/10/2022: Ca2+ 39.7 (11th %ile for age/sex/race matched control); < 25% pLAD   Cholelithiasis with choledocholithiasis    Colon polyps    DDD (degenerative disc disease), cervical    Diastolic dysfunction 02/25/2017   a.) TTE 02/25/2017: EF >55%, mild LVH, mild BAE, mild MAC, triv AR/MR/PR, mild TR, G1DD; b.) stress TTE 08/30/2017: EF >55%, triv panval regurg, AoV sclerosis; c.) TTE 03/23/2019: EF >55%, mild LVH, triv panval regurg, G1DD; d.) stress TTE 03/15/2020: EF >55%, triv panval regurg   Diverticulosis    ED (erectile dysfunction) of organic origin    Hydrocele, bilateral    Hyperlipidemia    Hypertension    LBBB (left bundle branch block)    Long term current use of anticoagulant    a.) apixaban    Lumbar stenosis    a.) s/p lumbar laminectomy in 2008   Nephrolithiasis    Pneumonia    Prostate cancer (HCC)    a.) s/p retropubic prostatectomy 10/2005   Pulmonary fibrosis (HCC)    Tinnitus     Past Surgical  History: Past Surgical History:  Procedure Laterality Date   BACK SURGERY     laminectomy lumbar   COLONOSCOPY WITH PROPOFOL  N/A 08/01/2015   Procedure: COLONOSCOPY WITH PROPOFOL ;  Surgeon: Lamar ONEIDA Holmes, MD;  Location: [facility] ENDOSCOPY;  Service: Endoscopy;  Laterality: N/A;   COLONOSCOPY WITH PROPOFOL  N/A 01/22/2021   Procedure: COLONOSCOPY WITH PROPOFOL ;  Surgeon: Toledo, Ladell POUR, MD;  Location: [facility] ENDOSCOPY;  Service: Gastroenterology;  Laterality: N/A;   CYSTOSCOPY/URETEROSCOPY/HOLMIUM LASER/STENT PLACEMENT Right 02/26/2023   Procedure: CYSTOSCOPY/URETEROSCOPY/HOLMIUM LASER/STENT PLACEMENT;  Surgeon: Francisca Redell BROCKS, MD;  Location: [facility] ORS;  Service: Urology;  Laterality: Right;   HYDROCELE EXCISION Bilateral 02/26/2023   Procedure: HYDROCELECTOMY ADULT;  Surgeon: Francisca Redell BROCKS, MD;  Location: [facility] ORS;  Service: Urology;  Laterality: Bilateral;   PERINEAL PROSTATECTOMY     pointer finger surgery  Left 05/2019   POLYPECTOMY     TONSILLECTOMY      Allergies: Allergies as of 05/30/2024 - Review Complete 05/30/2024  Allergen Reaction Noted   Latex Itching 08/01/2015    Medications:  Current Outpatient Medications:    acetaminophen  (TYLENOL ) 500 MG tablet, Take 500 mg by mouth every 6 (six) hours as needed., Disp: , Rfl:    amLODipine (NORVASC) 5 MG tablet, Take 5 mg by mouth 2 (two) times daily., Disp: , Rfl:    atorvastatin (LIPITOR) 40 MG tablet, Take 40 mg by mouth daily., Disp: , Rfl:    Cholecalciferol (VITAMIN D-3) 25 MCG (1000 UT) CAPS, Take by mouth., Disp: , Rfl:    Choline Fenofibrate 135 MG capsule, Take 135 mg by mouth daily., Disp: , Rfl:    cyanocobalamin (VITAMIN B12) 100 MCG tablet, Take 100 mcg by mouth daily., Disp: , Rfl:    ELIQUIS  2.5 MG TABS tablet, Take 5 mg by mouth 2 (two) times daily., Disp: , Rfl:    flecainide (TAMBOCOR) 50 MG tablet, Take 50 mg by mouth 2 (two) times daily., Disp: , Rfl:    folic acid (FOLVITE) 1 MG tablet, Take 1 mg by mouth  daily., Disp: , Rfl:    Magnesium 250 MG TABS, Take 250 mg by mouth daily., Disp: , Rfl:    meloxicam (MOBIC) 7.5 MG tablet, Take 7.5 mg by mouth 2 (two) times daily., Disp: , Rfl:    methotrexate (RHEUMATREX) 2.5 MG tablet, Take 10 mg by mouth once a week., Disp: , Rfl:    metoprolol  tartrate (LOPRESSOR ) 25 MG tablet, Take 0.5 tablets (12.5 mg total) by mouth 2 (two) times daily., Disp: 90 tablet, Rfl: 3   VENTOLIN HFA 108 (90 Base) MCG/ACT inhaler, SMARTSIG:2 inhalation Via Inhaler Every 6 Hours PRN, Disp: , Rfl:   Social History: Social History   Tobacco Use   Smoking status: Former    Current packs/day: 0.00    Average packs/day: 2.0 packs/day for 30.0 years (60.0 ttl pk-yrs)    Types: Cigarettes    Start date: 07/20/1957    Quit date: 07/21/1987    Years since quitting: 36.8    Passive exposure: Past   Smokeless tobacco: Never  Vaping Use   Vaping status: Never Used  Substance Use Topics   Alcohol use: Yes    Alcohol/week: 1.0 standard drink of alcohol    Types: 1 Glasses of wine per week    Comment: 1-2 drinks per day   Drug use: No    Family Medical History: No family history on file.  Physical Examination: Vitals:   05/30/24 0758  BP: 118/76    General: Patient is in no apparent distress. Attention to examination is appropriate.  Neck:   Supple.  Full range of motion.  Respiratory: Patient is breathing without any difficulty.   NEUROLOGICAL:     Awake, alert, oriented to person, place, and time.  Speech is clear and fluent.   Cranial Nerves: Pupils equal round and reactive to light.  Facial tone is symmetric.  Facial sensation is symmetric. Shoulder shrug is symmetric. Tongue protrusion is midline.  There is no pronator drift.  Strength: Side Biceps Triceps Deltoid Interossei Grip Wrist Ext. Wrist Flex.  R 5 5 5 5 5 5 5   L 5 5 5 5 5 5 5    Side Iliopsoas Quads Hamstring PF DF EHL  R 5 5 5 5 5 5   L 5 5 5 5 5 5    Reflexes  are 1+ and symmetric at the  biceps, triceps, brachioradialis, patella and achilles.   Hoffman's is absent.   Bilateral upper and lower extremity sensation is intact to light touch.    No evidence of dysmetria noted.  Gait is antalgic.     Medical Decision Making  Imaging: MR L spine 05/04/2024 IMPRESSION: 1. Multilevel degenerative changes of the lumbar spine are not significantly progressed from prior study. 2. Mild canal stenosis at L2-3 and L4-5. 3. Severe foraminal stenosis on the right at L4-5 and on the left at L5-S1. Moderate-severe right foraminal stenosis at L5-S1. 4. Reactive subchondral marrow signal changes associated with the L5-S1 facet joints, more pronounced on the right. Small right L5-S1 facet joint effusion. This may be a focal source of pain.     Electronically Signed   By: Mabel Converse D.O.   On: 05/06/2024 14:50  I have personally reviewed the images and agree with the above interpretation.  Assessment and Plan: Mr. Fesler is a pleasant 83 y.o. male with low back pain with bilateral sciatica.  He has severe lumbar spondylosis at L4-5 and L5-S1.  He has a vacuum disc phenomenon at L4-5 with significant disc height collapse.  At L5-S1, he has severe facet arthrosis.  This has resulted in some moderate to severe on the right and severe left foraminal stenosis at L5-S1 with severe foraminal stenosis on the right at L4-5.  I do not think that adequate decompression L5-S1 could be achieved without bilateral facetectomy and iatrogenic stabilization.  As such, I recommended L5-S1 bilateral facetectomy with instrumentation.  At L4-5, he has severe disc height collapse with foraminal compression of the right L4 nerve root.  I do not think direct decompression will adequately decompress this nerve root.  As such, I recommended L4-5 lateral lumbar interbody fusion.  I have recommended L4-5 lateral lumbar interbody fusion with posterior fixation from L4-S1 with bilateral facetectomy at L5-S1.  I  think this will directly address both of his issues.  I discussed the planned procedure at length with the patient, including the risks, benefits, alternatives, and indications. The risks discussed include but are not limited to bleeding, infection, need for reoperation, spinal fluid leak, stroke, vision loss, anesthetic complication, coma, paralysis, and even death. I also described the possibility of psoas weakness and paresthesias. I described in detail that improvement was not guaranteed.  The patient expressed understanding of these risks, and asked that we proceed with surgery. I described the surgery in layman's terms, and gave ample opportunity for questions, which were answered to the best of my ability.  I spent a total of 30 minutes in this patient's care today. This time was spent reviewing pertinent records including imaging studies, obtaining and confirming history, performing a directed evaluation, formulating and discussing my recommendations, and documenting the visit within the medical record.       Thank you for involving me in the care of this patient.      Loretha Ure K. Clois MD, [facility]

## 2024-05-30 ENCOUNTER — Other Ambulatory Visit: Payer: Self-pay

## 2024-05-30 ENCOUNTER — Encounter: Payer: Self-pay | Admitting: Neurosurgery

## 2024-05-30 ENCOUNTER — Ambulatory Visit (INDEPENDENT_AMBULATORY_CARE_PROVIDER_SITE_OTHER): Admitting: Neurosurgery

## 2024-05-30 VITALS — BP 118/76 | Wt 204.2 lb

## 2024-05-30 DIAGNOSIS — M48061 Spinal stenosis, lumbar region without neurogenic claudication: Secondary | ICD-10-CM

## 2024-05-30 DIAGNOSIS — M5441 Lumbago with sciatica, right side: Secondary | ICD-10-CM

## 2024-05-30 DIAGNOSIS — G8929 Other chronic pain: Secondary | ICD-10-CM

## 2024-05-30 DIAGNOSIS — M47817 Spondylosis without myelopathy or radiculopathy, lumbosacral region: Secondary | ICD-10-CM | POA: Diagnosis not present

## 2024-05-30 DIAGNOSIS — Z01818 Encounter for other preprocedural examination: Secondary | ICD-10-CM

## 2024-05-30 DIAGNOSIS — M5442 Lumbago with sciatica, left side: Secondary | ICD-10-CM | POA: Diagnosis not present

## 2024-05-30 NOTE — Patient Instructions (Signed)
 Please see below for information in regards to your upcoming surgery:   Planned surgery: L4-5 Lateral Lumbar Interbody Fusion, L4-S1 Posterior Spinal Fusion (open), L5-S1 Bilateral Facetectomies   Surgery date: 06/26/24 at [facility] (Medical Mall: [address], [place], [place] [address]) - you will find out your arrival time the business day before your surgery.   Pre-op appointment at [facility] Pre-admit Testing: you will receive a call with a date/time for this appointment. If you are scheduled for an in person appointment, Pre-admit Testing is located on the first floor of the [facility] building, [address], [address]. During this appointment, they will advise you which medications you can take the morning of surgery, and which medications you will need to hold for surgery. Labs (such as blood work, EKG) may be done at your pre-op appointment. You are not required to fast for these labs. Should you need to change your pre-op appointment, please call Pre-admit testing at [phone].      Blood thinners:  Eliquis  (apixaban ):   stop Eliquis  3 days prior, resume Eliquis  14 days after       Surgical clearance: we will send a clearance form to [facility]. They may wish to see you in their office prior to signing the clearance form. If so, they may call you to schedule an appointment.      NSAIDS (Non-steroidal anti-inflammatory drugs): because you are having a fusion, please avoid taking any NSAIDS (examples: ibuprofen, motrin, aleve, naproxen, meloxicam, diclofenac) for 3 months after surgery. Celebrex is an exception and is OK to take, if prescribed. Tylenol  is not an NSAID.     Common restrictions after spine surgery: No bending, lifting, or twisting ("BLT"). Avoid lifting objects heavier than 10 pounds for the first 6 weeks after surgery. Where possible, avoid household activities that involve lifting,  bending, reaching, pushing, or pulling such as laundry, vacuuming, grocery shopping, and childcare. Try to arrange for help from friends and family for these activities while you heal. Do not drive while taking prescription pain medication. Weeks 6 through 12 after surgery: avoid lifting more than 25 pounds.     X-rays after surgery: Because you are having a fusion or arthroplasty: for appointments after your 2 week follow-up: please arrive our office 30 minutes prior to your appointment for x-rays. This applies to every appointment after your 2 week follow-up. Failure to do so may result in your appointment being rescheduled.    How to contact us :  If you have any questions/concerns before or after surgery, you can reach us  at [phone], or you can send a mychart message. We can be reached by phone or mychart 8am-4pm, Monday-Friday.  *Please note: Calls after 4pm are forwarded to a third party answering service. Mychart messages are not routinely monitored during evenings, weekends, and holidays. Please call our office to contact the answering service for urgent concerns during non-business hours.    If you have FMLA/disability paperwork, please drop it off or fax it to [phone]   Appointments/FMLA & disability paperwork: Reche Hait, & Nichole Registered Nurse/Surgery scheduler: Kendelyn, RN & Katie, RN Certified Medical Assistants: Don, CMA, Elenor, CMA, Damien, CMA, & Auston, [place] Physician Assistants: Lyle Decamp, PA-C, Edsel Goods, PA-C & Glade Boys, PA-C Surgeons: Penne Sharps, MD & Reeves Daisy, MD     [facility] PREADMIT TESTING VISIT and SURGERY INFORMATION SHEET   Now that surgery has been scheduled you can anticipate several  phone calls from [facility]. A pharmacy technician will call you to verify your current list of medications taken at home.               The Pre-Service Center will call to verify your insurance  information and to give you billing estimates and information.             The Preadmit Testing Office will be calling to schedule a visit to obtain information for the anesthesia team and provide instructions on preparation for surgery.  What can you expect for the Preadmit Testing Visit: Appointments may be scheduled in-person or by telephone.  If a telephone visit is scheduled, you may be asked to come into the office to have lab tests or other studies performed.   This visit will not be completed any greater than 14 days prior to your surgery.  If your surgery has been scheduled for a future date, please do not be alarmed if we have not contacted you to schedule an appointment more than a month prior to the surgery date.    Please be prepared to provide the following information during this appointment:            -Personal medical history                                               -Medication and allergy list            -Any history of problems with anesthesia              -Recent lab work or diagnostic studies            -Please notify us  of any needs we should be aware of to provide the best care possible           -You will be provided with instructions on how to prepare for your surgery.    On The Day of Surgery:  You must have a driver to take you home after surgery, you will be asked not to drive for 24 hours following surgery.  Taxi, Gisele and non-medical transport will not be acceptable means of transportation unless you have a responsible individual who will be traveling with you.  Visitors in the surgical area:   2 people will be able to visit you in your room once your preparation for surgery has been completed. During surgery, your visitors will be asked to wait in the Surgery Waiting Area.  It is not a requirement for them to stay, if they prefer to leave and come back.  Your visitor(s) will be given an update once the surgery has been completed.  No visitors are allowed in  the initial recovery room to respect patient privacy and safety.  Once you are more awake and transfer to the secondary recovery area, or are transferred to an inpatient room, visitors will again be able to see you.  To respect and protect your privacy: We will ask on the day of surgery who your driver will be and what the contact number for that individual will be. We will ask if it is okay to share information with this individual, or if there is an alternative individual that we, or the surgeon, should contact to provide updates and information. If family or friends come to the surgical information desk requesting information  about you, who you have not listed with us , no information will be given.   It may be helpful to designate someone as the main contact who will be responsible for updating your other friends and family.    PREADMIT TESTING OFFICE: [phone] SAME DAY SURGERY: [phone] We look forward to caring for you before and throughout the process of your surgery.

## 2024-06-08 ENCOUNTER — Emergency Department

## 2024-06-08 ENCOUNTER — Emergency Department
Admission: EM | Admit: 2024-06-08 | Discharge: 2024-06-08 | Disposition: A | Discharge status: Home / Self Care | Attending: Emergency Medicine | Admitting: Emergency Medicine

## 2024-06-08 DIAGNOSIS — Z79899 Other long term (current) drug therapy: Secondary | ICD-10-CM | POA: Diagnosis not present

## 2024-06-08 DIAGNOSIS — R1013 Epigastric pain: Secondary | ICD-10-CM | POA: Diagnosis present

## 2024-06-08 DIAGNOSIS — I10 Essential (primary) hypertension: Secondary | ICD-10-CM | POA: Diagnosis not present

## 2024-06-08 DIAGNOSIS — K219 Gastro-esophageal reflux disease without esophagitis: Secondary | ICD-10-CM | POA: Diagnosis not present

## 2024-06-08 DIAGNOSIS — Z7901 Long term (current) use of anticoagulants: Secondary | ICD-10-CM | POA: Insufficient documentation

## 2024-06-08 DIAGNOSIS — I2510 Atherosclerotic heart disease of native coronary artery without angina pectoris: Secondary | ICD-10-CM | POA: Insufficient documentation

## 2024-06-08 DIAGNOSIS — Z8546 Personal history of malignant neoplasm of prostate: Secondary | ICD-10-CM | POA: Diagnosis not present

## 2024-06-08 DIAGNOSIS — I4891 Unspecified atrial fibrillation: Secondary | ICD-10-CM | POA: Diagnosis not present

## 2024-06-08 LAB — CBC
HCT: 40.6 % (ref 39.0–52.0)
Hemoglobin: 13.5 g/dL (ref 13.0–17.0)
MCH: 30.5 pg (ref 26.0–34.0)
MCHC: 33.3 g/dL (ref 30.0–36.0)
MCV: 91.9 fL (ref 80.0–100.0)
Platelets: 262 K/uL (ref 150–400)
RBC: 4.42 MIL/uL (ref 4.22–5.81)
RDW: 14.2 % (ref 11.5–15.5)
WBC: 6.7 K/uL (ref 4.0–10.5)
nRBC: 0 % (ref 0.0–0.2)

## 2024-06-08 LAB — BASIC METABOLIC PANEL WITH GFR
Anion gap: 11 (ref 5–15)
BUN: 15 mg/dL (ref 8–23)
CO2: 27 mmol/L (ref 22–32)
Calcium: 9.3 mg/dL (ref 8.9–10.3)
Chloride: 102 mmol/L (ref 98–111)
Creatinine, Ser: 1.25 mg/dL — ABNORMAL HIGH (ref 0.61–1.24)
GFR, Estimated: 57 mL/min — ABNORMAL LOW (ref >60)
Glucose, Bld: 114 mg/dL — ABNORMAL HIGH (ref 70–99)
Potassium: 4.1 mmol/L (ref 3.5–5.1)
Sodium: 139 mmol/L (ref 135–145)

## 2024-06-08 LAB — TROPONIN T, HIGH SENSITIVITY
Troponin T High Sensitivity: 20 ng/L — ABNORMAL HIGH (ref 0–19)
Troponin T High Sensitivity: 20 ng/L — ABNORMAL HIGH (ref 0–19)

## 2024-06-08 MED ORDER — PANTOPRAZOLE SODIUM 40 MG IV SOLR
40.0000 mg | Freq: Once | INTRAVENOUS | Status: AC
  Administered 2024-06-08: 40 mg via INTRAVENOUS
  Filled 2024-06-08: qty 10

## 2024-06-08 MED ORDER — IOHEXOL 350 MG/ML SOLN
100.0000 mL | Freq: Once | INTRAVENOUS | Status: AC | PRN
  Administered 2024-06-08: 100 mL via INTRAVENOUS

## 2024-06-08 NOTE — ED Provider Notes (Signed)
 [facility] Provider Note    Event Date/Time   First MD Initiated Contact with Patient 06/08/24 1646     (approximate)   History   Chief Complaint: Chest Pain   HPI  John Marks is a 83 y.o. male with a history of GERD, aortic aneurysm, atrial fibrillation on Eliquis , hypertension who comes ED complaining of epigastric pain that was sudden onset while at rest, radiating into the chest.  No shortness of breath dizziness.  Does not feel like his usual acid reflux.  No fever or vomiting.  No diaphoresis.        Past Medical History:  Diagnosis Date   Acid reflux    Aortic atherosclerosis    Aortic stenosis 03/15/2020   a.) stress TTE 03/15/2020: mild (MPG 10.8 mmHg; AVA = 1.3cm2)   Arthritis    Ascending aorta dilatation 04/16/2020   a.) CT chest 04/16/2020: asc thoracic Ao measured 39 mm   Atrial fibrillation (HCC)    a.) CHA2DS2VASc = 4 (age x2, HTN, vascular disease history);  b.) rate/rhythm maintained on oral flecainide + metoprolol  tartrate; chronically anticoagulated with dose reduced apixaban    Bladder outlet obstruction    CAD (coronary artery disease) 09/10/2022   a.) cCTA 09/10/2022: Ca2+ 39.7 (11th %ile for age/sex/race matched control); < 25% pLAD   Cholelithiasis with choledocholithiasis    Colon polyps    DDD (degenerative disc disease), cervical    Diastolic dysfunction 02/25/2017   a.) TTE 02/25/2017: EF >55%, mild LVH, mild BAE, mild MAC, triv AR/MR/PR, mild TR, G1DD; b.) stress TTE 08/30/2017: EF >55%, triv panval regurg, AoV sclerosis; c.) TTE 03/23/2019: EF >55%, mild LVH, triv panval regurg, G1DD; d.) stress TTE 03/15/2020: EF >55%, triv panval regurg   Diverticulosis    ED (erectile dysfunction) of organic origin    Hydrocele, bilateral    Hyperlipidemia    Hypertension    LBBB (left bundle branch block)    Long term current use of anticoagulant    a.) apixaban    Lumbar stenosis    a.) s/p lumbar laminectomy in 2008    Nephrolithiasis    Pneumonia    Prostate cancer (HCC)    a.) s/p retropubic prostatectomy 10/2005   Pulmonary fibrosis [facility])    Tinnitus     Current Outpatient Rx   Order #: [id number] Class: Historical Med   Order #: [id number] Class: Historical Med   Order #: [id number] Class: Historical Med   Order #: [id number] Class: Historical Med   Order #: [id number] Class: Historical Med   Order #: [id number] Class: Historical Med   Order #: [id number] Class: Historical Med   Order #: [id number] Class: Historical Med   Order #: [id number] Class: Historical Med   Order #: [id number] Class: No Print   Order #: [id number] Class: Historical Med    Past Surgical History:  Procedure Laterality Date   BACK SURGERY     laminectomy lumbar   COLONOSCOPY WITH PROPOFOL  N/A 08/01/2015   Procedure: COLONOSCOPY WITH PROPOFOL ;  Surgeon: Lamar ONEIDA Holmes, MD;  Location: [facility] ENDOSCOPY;  Service: Endoscopy;  Laterality: N/A;   COLONOSCOPY WITH PROPOFOL  N/A 01/22/2021   Procedure: COLONOSCOPY WITH PROPOFOL ;  Surgeon: Toledo, Ladell POUR, MD;  Location: [facility] ENDOSCOPY;  Service: Gastroenterology;  Laterality: N/A;   CYSTOSCOPY/URETEROSCOPY/HOLMIUM LASER/STENT PLACEMENT Right 02/26/2023   Procedure: CYSTOSCOPY/URETEROSCOPY/HOLMIUM LASER/STENT PLACEMENT;  Surgeon: Francisca Redell BROCKS, MD;  Location: [facility] ORS;  Service: Urology;  Laterality: Right;   HYDROCELE EXCISION Bilateral 02/26/2023   Procedure: HYDROCELECTOMY ADULT;  Surgeon: Francisca Redell BROCKS, MD;  Location: [facility] ORS;  Service: Urology;  Laterality: Bilateral;   PERINEAL PROSTATECTOMY     pointer finger surgery Left 05/2019   POLYPECTOMY     TONSILLECTOMY      Physical Exam   Triage Vital Signs: ED Triage Vitals  Encounter Vitals Group     BP 06/08/24 1626 (!) 140/69     Girls Systolic BP Percentile --      Girls Diastolic BP Percentile --      Boys Systolic BP Percentile --      Boys Diastolic BP Percentile --      Pulse Rate 06/08/24 1626 (!) 59     Resp 06/08/24  1626 18     Temp 06/08/24 1626 98.3 F (36.8 C)     Temp Source 06/08/24 1626 Oral     SpO2 06/08/24 1626 99 %     Weight 06/08/24 1627 205 lb (93 kg)     Height 06/08/24 1627 6' (1.829 m)     Head Circumference --      Peak Flow --      Pain Score 06/08/24 1627 7     Pain Loc --      Pain Education --      Exclude from Growth Chart --     Most recent vital signs: Vitals:   06/08/24 1626 06/08/24 1958  BP: (!) 140/69 137/69  Pulse: (!) 59 60  Resp: 18 18  Temp: 98.3 F (36.8 C) 98.1 F (36.7 C)  SpO2: 99% 99%    General: Awake, no distress.  CV:  Good peripheral perfusion.  Regular rate rhythm Resp:  Normal effort.  Clear lungs Abd:  No distention.  Soft with epigastric tenderness Other:  Normal distal pulses   ED Results / Procedures / Treatments   Labs (all labs ordered are listed, but only abnormal results are displayed) Labs Reviewed  BASIC METABOLIC PANEL WITH GFR - Abnormal; Notable for the following components:      Result Value   Glucose, Bld 114 (*)    Creatinine, Ser 1.25 (*)    GFR, Estimated 57 (*)    All other components within normal limits  TROPONIN T, HIGH SENSITIVITY - Abnormal; Notable for the following components:   Troponin T High Sensitivity 20 (*)    All other components within normal limits  TROPONIN T, HIGH SENSITIVITY - Abnormal; Notable for the following components:   Troponin T High Sensitivity 20 (*)    All other components within normal limits  CBC     EKG Interpreted by me Sinus rhythm rate of 58.  Left axis, first-degree AV block.  Poor R wave progression.  No acute ischemic changes.   RADIOLOGY Chest x-ray interpreted by me, unremarkable.  Radiology report reviewed   PROCEDURES:  Procedures   MEDICATIONS ORDERED IN ED: Medications  pantoprazole (PROTONIX) injection 40 mg (40 mg Intravenous Given 06/08/24 1719)  iohexol  (OMNIPAQUE ) 350 MG/ML injection 100 mL (100 mLs Intravenous Contrast Given 06/08/24 1817)      IMPRESSION / MDM / ASSESSMENT AND PLAN / ED COURSE  I reviewed the triage vital signs and the nursing notes.  DDx: GERD/gastritis, abdominal aortic aneurysm, aortic dissection, intestinal gas, pneumothorax  Patient's presentation is most consistent with acute presentation with potential threat to life or bodily function.  Patient presents with sudden onset of epigastric pain, has some tenderness in the area.  Most likely gastric versus aortic.  Doubt biliary disease, pancreatitis, ACS, PE.  Labs including serial troponin are  unremarkable.  Vital signs stable.  CT angiogram chest abdomen pelvis negative for acute findings.  On reassessment, symptoms have resolved after IV Protonix .  Patient stable for discharge.       FINAL CLINICAL IMPRESSION(S) / ED DIAGNOSES   Final diagnoses:  Epigastric pain  Gastroesophageal reflux disease, unspecified whether esophagitis present     Rx / DC Orders   ED Discharge Orders     None        Note:  This document was prepared using Dragon voice recognition software and may include unintentional dictation errors.   Viviann Pastor, MD 06/08/24 2020

## 2024-06-08 NOTE — Discharge Instructions (Signed)
 Continue taking all of your medications as usual.  Take Tums and/or pepcid  as needed for intermittent acid reflux symptoms.

## 2024-06-08 NOTE — ED Triage Notes (Signed)
 Pt presents to the ED via POV from home with epigastric abdominal pain and chest pain that started suddenly around 1520 today. Pt reports hx of afib. Pt takes metoprolol , flecainide, and eliquis  for afib. Pt reports some reflux during this event. Pt reports continued 6/10 chest pain

## 2024-06-13 ENCOUNTER — Other Ambulatory Visit: Payer: Self-pay

## 2024-06-13 ENCOUNTER — Encounter
Admission: RE | Admit: 2024-06-13 | Discharge: 2024-06-13 | Disposition: A | Discharge status: Home / Self Care | Source: Ambulatory Visit | Attending: Neurosurgery | Admitting: Neurosurgery

## 2024-06-13 VITALS — BP 144/75 | HR 60 | Resp 16 | Wt 206.8 lb

## 2024-06-13 DIAGNOSIS — M47817 Spondylosis without myelopathy or radiculopathy, lumbosacral region: Secondary | ICD-10-CM | POA: Diagnosis not present

## 2024-06-13 DIAGNOSIS — M5442 Lumbago with sciatica, left side: Secondary | ICD-10-CM | POA: Diagnosis not present

## 2024-06-13 DIAGNOSIS — Z01818 Encounter for other preprocedural examination: Secondary | ICD-10-CM | POA: Diagnosis present

## 2024-06-13 DIAGNOSIS — G8929 Other chronic pain: Secondary | ICD-10-CM | POA: Insufficient documentation

## 2024-06-13 DIAGNOSIS — M5441 Lumbago with sciatica, right side: Secondary | ICD-10-CM | POA: Insufficient documentation

## 2024-06-13 DIAGNOSIS — Z01812 Encounter for preprocedural laboratory examination: Secondary | ICD-10-CM | POA: Insufficient documentation

## 2024-06-13 DIAGNOSIS — M51362 Other intervertebral disc degeneration, lumbar region with discogenic back pain and lower extremity pain: Secondary | ICD-10-CM

## 2024-06-13 HISTORY — DX: Spondylosis without myelopathy or radiculopathy, lumbar region: M47.816

## 2024-06-13 HISTORY — DX: Spinal stenosis, lumbar region without neurogenic claudication: M48.061

## 2024-06-13 HISTORY — DX: Dyspnea, unspecified: R06.00

## 2024-06-13 LAB — URINALYSIS, COMPLETE (UACMP) WITH MICROSCOPIC
Bacteria, UA: NONE SEEN
Bilirubin Urine: NEGATIVE
Glucose, UA: NEGATIVE mg/dL
Hgb urine dipstick: NEGATIVE
Ketones, ur: NEGATIVE mg/dL
Leukocytes,Ua: NEGATIVE
Nitrite: NEGATIVE
Protein, ur: NEGATIVE mg/dL
Specific Gravity, Urine: 1.017 (ref 1.005–1.030)
pH: 6 (ref 5.0–8.0)

## 2024-06-13 LAB — TYPE AND SCREEN
ABO/RH(D): O POS
Antibody Screen: NEGATIVE

## 2024-06-13 LAB — SURGICAL PCR SCREEN
MRSA, PCR: NEGATIVE
Staphylococcus aureus: NEGATIVE

## 2024-06-13 NOTE — Patient Instructions (Addendum)
 Your procedure is scheduled on: 06/26/24 - Monday Report to the Registration Desk on the 1st floor of the [facility]. To find out your arrival time, please call [phone] between 1PM - 3PM on: 06/23/24 - Friday If your arrival time is 6:00 am, do not arrive before that time as the [facility] entrance doors do not open until 6:00 am.  REMEMBER: Instructions that are not followed completely may result in serious medical risk, up to and including death; or upon the discretion of your surgeon and anesthesiologist your surgery may need to be rescheduled.  Do not eat food after midnight the night before surgery.  No gum chewing or hard candies.  You may however, drink CLEAR liquids up to 2 hours before you are scheduled to arrive for your surgery. Do not drink anything within 2 hours of your scheduled arrival time.  Clear liquids include: - water  - apple juice without pulp - gatorade (not RED colors) - black coffee or tea (Do NOT add milk or creamers to the coffee or tea) Do NOT drink anything that is not on this list.  NSAIDS (Non-steroidal anti-inflammatory drugs): because you are having a fusion, please avoid taking any NSAIDS (examples: ibuprofen, motrin, aleve, naproxen, meloxicam, diclofenac) for 3 months after surgery. Celebrex is an exception and is OK to take, if prescribed. Tylenol  is not an NSAID.  You may continue to take Tylenol  if needed for pain up until the day of surgery  Stop beginning 12/01,  ANY OVER THE COUNTER supplements until after surgery. You may continue Vitamin B12.  We have instructed the patient to hold their blood thinner for surgery as listed below: Stop taking on 12/05. Eliquis : stop Eliquis  3 days prior, resume Eliquis  14 days after    ON THE DAY OF SURGERY ONLY TAKE THESE MEDICATIONS WITH SIPS OF WATER:  amLODipine (NORVASC)  flecainide (TAMBOCOR)  metoprolol  tartrate  VENTOLIN HFA bring to hospital with you.   No Alcohol for 24 hours before  or after surgery.  No Smoking including e-cigarettes for 24 hours before surgery.  No chewable tobacco products for at least 6 hours before surgery.  No nicotine patches on the day of surgery.  Do not use any recreational drugs for at least a week (preferably 2 weeks) before your surgery.  Please be advised that the combination of cocaine and anesthesia may have negative outcomes, up to and including death. If you test positive for cocaine, your surgery will be cancelled.  On the morning of surgery brush your teeth with toothpaste and water, you may rinse your mouth with mouthwash if you wish. Do not swallow any toothpaste or mouthwash.  Use CHG Soap or wipes as directed on instruction sheet.  Do not wear jewelry, make-up, hairpins, clips or nail polish.  For welded (permanent) jewelry: bracelets, anklets, waist bands, etc.  Please have this removed prior to surgery.  If it is not removed, there is a chance that hospital personnel will need to cut it off on the day of surgery.  Do not wear lotions, powders, or perfumes.   Do not shave body hair from the neck down 48 hours before surgery.  Contact lenses, hearing aids and dentures may not be worn into surgery.  Do not bring valuables to the hospital. [facility] is not responsible for any missing/lost belongings or valuables.   Notify your doctor if there is any change in your medical condition (cold, fever, infection).  Wear comfortable clothing (specific to your  surgery type) to the hospital.  After surgery, you can help prevent lung complications by doing breathing exercises.  Take deep breaths and cough every 1-2 hours. Your doctor may order a device called an Incentive Spirometer to help you take deep breaths.  If you are being admitted to the hospital overnight, leave your suitcase in the car. After surgery it may be brought to your room.  In case of increased patient census, it may be necessary for you, the patient, to  continue your postoperative care in the Same Day Surgery department.  If you are being discharged the day of surgery, you will not be allowed to drive home. You will need a responsible individual to drive you home and stay with you for 24 hours after surgery.   If you are taking public transportation, you will need to have a responsible individual with you.  Please call the Pre-admissions Testing Dept. at [phone] if you have any questions about these instructions.  Surgery Visitation Policy:  Patients having surgery or a procedure may have two visitors.  Children under the age of 58 must have an adult with them who is not the patient.  Inpatient Visitation:    Visiting hours are 7 a.m. to 8 p.m. Up to four visitors are allowed at one time in a patient room. The visitors may rotate out with other people during the day.  One visitor age 65 or older may stay with the patient overnight and must be in the room by 8 p.m.   Merchandiser, Retail to address health-related social needs:  [email or web address]    Pre-operative 4 CHG Bath Instructions   You can play a key role in reducing the risk of infection after surgery. Your skin needs to be as free of germs as possible. You can reduce the number of germs on your skin by washing with CHG (chlorhexidine  gluconate) soap before surgery. CHG is an antiseptic soap that kills germs and continues to kill germs even after washing.   DO NOT use if you have an allergy to chlorhexidine /CHG or antibacterial soaps. If your skin becomes reddened or irritated, stop using the CHG and notify one of our RNs at [phone].   Please shower with the CHG soap starting 4 days before surgery using the following schedule: 12/04 - 12/07.    Please keep in mind the following:  DO NOT shave, including legs and underarms, starting the day of your first shower.   You may shave your face at any point before/day of surgery.  Place clean  sheets on your bed the day you start using CHG soap. Use a clean washcloth (not used since being washed) for each shower. DO NOT sleep with pets once you start using the CHG.   CHG Shower Instructions:  If you choose to wash your hair and private area, wash first with your normal shampoo/soap.  After you use shampoo/soap, rinse your hair and body thoroughly to remove shampoo/soap residue.  Turn the water OFF and apply about 3 tablespoons (45 ml) of CHG soap to a CLEAN washcloth.  Apply CHG soap ONLY FROM YOUR NECK DOWN TO YOUR TOES (washing for 3-5 minutes)  DO NOT use CHG soap on face, private areas, open wounds, or sores.  Pay special attention to the area where your surgery is being performed.  If you are having back surgery, having someone wash your back for you may be helpful. Wait 2 minutes after CHG soap is applied, then you may rinse  off the CHG soap.  Pat dry with a clean towel  Put on clean clothes/pajamas   If you choose to wear lotion, please use ONLY the CHG-compatible lotions on the back of this paper.     Additional instructions for the day of surgery: DO NOT APPLY any lotions, deodorants, cologne, or perfumes.   Put on clean/comfortable clothes.  Brush your teeth.  Ask your nurse before applying any prescription medications to the skin.      CHG Compatible Lotions   Aveeno Moisturizing lotion  Cetaphil Moisturizing Cream  Cetaphil Moisturizing Lotion  Clairol Herbal Essence Moisturizing Lotion, Dry Skin  Clairol Herbal Essence Moisturizing Lotion, Extra Dry Skin  Clairol Herbal Essence Moisturizing Lotion, Normal Skin  Curel Age Defying Therapeutic Moisturizing Lotion with Alpha Hydroxy  Curel Extreme Care Body Lotion  Curel Soothing Hands Moisturizing Hand Lotion  Curel Therapeutic Moisturizing Cream, Fragrance-Free  Curel Therapeutic Moisturizing Lotion, Fragrance-Free  Curel Therapeutic Moisturizing Lotion, Original Formula  Eucerin Daily Replenishing  Lotion  Eucerin Dry Skin Therapy Plus Alpha Hydroxy Crme  Eucerin Dry Skin Therapy Plus Alpha Hydroxy Lotion  Eucerin Original Crme  Eucerin Original Lotion  Eucerin Plus Crme Eucerin Plus Lotion  Eucerin TriLipid Replenishing Lotion  Keri Anti-Bacterial Hand Lotion  Keri Deep Conditioning Original Lotion Dry Skin Formula Softly Scented  Keri Deep Conditioning Original Lotion, Fragrance Free Sensitive Skin Formula  Keri Lotion Fast Absorbing Fragrance Free Sensitive Skin Formula  Keri Lotion Fast Absorbing Softly Scented Dry Skin Formula  Keri Original Lotion  Keri Skin Renewal Lotion Keri Silky Smooth Lotion  Keri Silky Smooth Sensitive Skin Lotion  Nivea Body Creamy Conditioning Oil  Nivea Body Extra Enriched Lotion  Nivea Body Original Lotion  Nivea Body Sheer Moisturizing Lotion Nivea Crme  Nivea Skin Firming Lotion  NutraDerm 30 Skin Lotion  NutraDerm Skin Lotion  NutraDerm Therapeutic Skin Cream  NutraDerm Therapeutic Skin Lotion  ProShield Protective Hand Cream  Provon moisturizing lotion  How to Use an Incentive Spirometer  An incentive spirometer is a tool that measures how well you are filling your lungs with each breath. Learning to take long, deep breaths using this tool can help you keep your lungs clear and active. This may help to reverse or lessen your chance of developing breathing (pulmonary) problems, especially infection. You may be asked to use a spirometer: After a surgery. If you have a lung problem or a history of smoking. After a long period of time when you have been unable to move or be active. If the spirometer includes an indicator to show the highest number that you have reached, your health care provider or respiratory therapist will help you set a goal. Keep a log of your progress as told by your health care provider. What are the risks? Breathing too quickly may cause dizziness or cause you to pass out. Take your time so you do not get dizzy or  light-headed. If you are in pain, you may need to take pain medicine before doing incentive spirometry. It is harder to take a deep breath if you are having pain. How to use your incentive spirometer  Sit up on the edge of your bed or on a chair. Hold the incentive spirometer so that it is in an upright position. Before you use the spirometer, breathe out normally. Place the mouthpiece in your mouth. Make sure your lips are closed tightly around it. Breathe in slowly and as deeply as you can through your mouth, causing the piston  or the ball to rise toward the top of the chamber. Hold your breath for 3-5 seconds, or for as long as possible. If the spirometer includes a coach indicator, use this to guide you in breathing. Slow down your breathing if the indicator goes above the marked areas. Remove the mouthpiece from your mouth and breathe out normally. The piston or ball will return to the bottom of the chamber. Rest for a few seconds, then repeat the steps 10 or more times. Take your time and take a few normal breaths between deep breaths so that you do not get dizzy or light-headed. Do this every 1-2 hours when you are awake. If the spirometer includes a goal marker to show the highest number you have reached (best effort), use this as a goal to work toward during each repetition. After each set of 10 deep breaths, cough a few times. This will help to make sure that your lungs are clear. If you have an incision on your chest or abdomen from surgery, place a pillow or a rolled-up towel firmly against the incision when you cough. This can help to reduce pain while taking deep breaths and coughing. General tips When you are able to get out of bed: Walk around often. Continue to take deep breaths and cough in order to clear your lungs. Keep using the incentive spirometer until your health care provider says it is okay to stop using it. If you have been in the hospital, you may be told to keep  using the spirometer at home. Contact a health care provider if: You are having difficulty using the spirometer. You have trouble using the spirometer as often as instructed. Your pain medicine is not giving enough relief for you to use the spirometer as told. You have a fever. Get help right away if: You develop shortness of breath. You develop a cough with bloody mucus from the lungs. You have fluid or blood coming from an incision site after you cough. Summary An incentive spirometer is a tool that can help you learn to take long, deep breaths to keep your lungs clear and active. You may be asked to use a spirometer after a surgery, if you have a lung problem or a history of smoking, or if you have been inactive for a long period of time. Use your incentive spirometer as instructed every 1-2 hours while you are awake. If you have an incision on your chest or abdomen, place a pillow or a rolled-up towel firmly against your incision when you cough. This will help to reduce pain. Get help right away if you have shortness of breath, you cough up bloody mucus, or blood comes from your incision when you cough. This information is not intended to replace advice given to you by your health care provider. Make sure you discuss any questions you have with your health care provider. Document Revised: 09/25/2019 Document Reviewed: 09/25/2019 Elsevier Patient Education  2023 [employer].

## 2024-06-21 ENCOUNTER — Encounter: Payer: Self-pay | Admitting: Neurosurgery

## 2024-06-21 NOTE — Progress Notes (Signed)
 Perioperative / Anesthesia Services  Pre-Admission Testing Clinical Review / Pre-Operative Anesthesia Consult  Date: 06/21/24  PATIENT DEMOGRAPHICS: Name: John Marks DOB: May 27, 1941 MRN:   [id number]  Note: Available PAT nursing documentation and vital signs have been reviewed. Clinical nursing staff has updated patient's PMH/PSHx, current medication list, and drug allergies/intolerances to ensure complete and comprehensive history available to assist care teams in MDM as it pertains to the aforementioned surgical procedure and anticipated anesthetic course. Extensive review of available clinical information personally performed. Nursing documentation reviewed. [facility] PMH and PSHx updated with any diagnoses and/or procedures that I have knowledge of that may have been inadvertently omitted during his intake with the pre-admission testing department's nursing staff.  PLANNED SURGICAL PROCEDURE(S):   Case: [id number] Date/Time: 06/26/24 0700   Procedures:      ANTERIOR LATERAL LUMBAR FUSION 1 LEVEL - L4-5 Lateral Lumbar Interbody Fusion     POSTERIOR LUMBAR FUSION 2 LEVEL - L4-S1 Posterior Spinal Fusion (open)     APPLICATION OF INTRAOPERATIVE CT SCAN     FACETECTOMY, SPINE, 1 LEVEL - L5-S1 bilateral facetectomies   Anesthesia type: General   Diagnosis:      Chronic bilateral low back pain with bilateral sciatica [M54.42, M54.41, G89.29]     Lumbosacral spondylosis without myelopathy [M47.817]   Pre-op diagnosis:      M54.42, M54.41, G89.29 Chronic bilateral low back pain with bilateral sciatica     M47.817 Lumbosacral spondylosis without myelopathy   Location: [facility] OR ROOM 03 / [facility]   Surgeons: Clois Fret, MD        CLINICAL DISCUSSION: John Marks is a 83 y.o. male who is submitted for pre-surgical anesthesia review and clearance prior to him undergoing the above procedure.  Patient is a Former Smoker (60 pack years; quit 07/1987).  Pertinent PMH includes: CAD, atrial fibrillation, aortic stenosis, diastolic dysfunction, ascending aorta dilatation, aortic atherosclerosis, LBBB, HTN, HLD, SOB, pulmonary fibrosis, GERD (no daily Tx), OA, cervical DDD, lumbar stenosis (s/p laminectomy), remote prostate cancer (s/p prostatectomy), BOO, nephrolithiasis,   Patient is followed by cardiology Philippe, MD). He was last seen in the cardiology clinic on 12/23/2023; notes reviewed. At the time of his clinic visit, patient doing well overall from a cardiovascular perspective. Patient denied any chest pain, shortness of breath, PND, orthopnea, palpitations, significant peripheral edema, weakness, fatigue, vertiginous symptoms, or presyncope/syncope. Patient with a past medical history significant for cardiovascular diagnoses. Documented physical exam was grossly benign, providing no evidence of acute exacerbation and/or decompensation of the patient's known cardiovascular conditions.  Cardiac CTA was performed on 09/10/2022 revealing an elevated coronary calcium score of 39.7. This placed patient in the 11th percentile for age/sex/race matched control. Study suggestive of a minimal (<25%) stenosis in the proximal LAD. Patient with normal coronary origin and RIGHT-sided dominance.   Most recent TTE performed on 06/10/2023 revealed a normal left ventricular systolic function with an EF of >55%. There was moderate LVH.  There were no regional wall motion abnormalities. Left ventricular diastolic Doppler parameters consistent with abnormal relaxation (G1DD).  LEFT atrium mildly enlarged.  Right ventricular size and function normal with a TAPSE measuring 2.7 cm  (normal range >/= 1.6 cm).  RVSP = 25.0 mmHg.  There was mild pan valvular regurgitation.  Mild aortic valve stenosis was noted; mean transvalvular pressure gradient = 18 mmHg; AVA (VTI) = 0.6 cm; DI = 0.36.  All remaining transvalvular gradients were noted to be normal providing no evidence of  hemodynamically significant valvular stenosis. Aorta normal in size with no evidence of ectasia or aneurysmal dilatation.  Patient with an atrial fibrillation diagnosis; CHA2DS2-VASc Score = 4 (age x 2, HTN, vascular disease history). His rate and rhythm are currently being maintained on oral flecainide + metoprolol  tartrate. He is chronically anticoagulated using apixaban ; reported to be compliant with therapy with no evidence or reports of GI bleeding.  Blood pressure reasonably controlled at 130/66 mmHg on currently prescribed beta-blocker (metoprolol  tartrate) and CCB (amlodipine) therapies. He is on atorvastatin + choline fenofibrate therapy for his HLD diagnosis and further ASCVD prevention.  He is not diabetic. Patient does not have an OSAH diagnosis.  Patient makes efforts to remain active.  He exercises 3-4 times a week.  He is able to complete all of his ADLs/IADLs without cardiovascular limitation.  Per the [facility], patient able to achieve >4 METS of physical activity without experiencing any significant degree of angina/anginal equivalent symptoms.  No changes were made to his medication regimen.  Patient to follow-up with outpatient cardiology in 1 year or sooner if needed.   John Marks is scheduled for an elective ANTERIOR LATERAL LUMBAR FUSION 1 LEVEL; POSTERIOR LUMBAR FUSION 2 LEVEL; APPLICATION OF INTRAOPERATIVE CT SCAN; FACETECTOMY, SPINE, 1 LEVEL on 06/26/2024 with Dr. Reeves Daisy, MD. Given patient's past medical history significant for cardiovascular diagnoses, presurgical cardiac clearance was sought by the PAT team. Per cardiology, this patient is optimized for surgery and may proceed with the planned procedural course with a LOW risk of significant perioperative cardiovascular complications.  Again, this patient is on daily oral anticoagulation therapy using a DOAC medication.  He has been instructed on recommendations for holding his apixaban  for 3 days prior to his procedure  with plans to restart as soon as postoperative bleeding risk felt to be minimized by his primary attending surgeon. The patient has been instructed that his last dose should be on 06/22/2024.  Patient denies previous perioperative complications with anesthesia in the past. In review his EMR, it is noted that patient underwent a general anesthetic course at [facility] ([facility]) in 06/2023 without documented complications.   MOST RECENT VITAL SIGNS:    06/13/2024   10:31 AM 06/08/2024    7:58 PM 06/08/2024    4:27 PM  Vitals with BMI  Height   6' 0  Weight 206 lbs 13 oz  205 lbs  BMI 28.04  27.8  Systolic 144 137   Diastolic 75 69   Pulse 60 60    PROVIDERS/SPECIALISTS: NOTE: Primary physician provider listed below. Patient may have been seen by APP or partner within same practice.   PROVIDER ROLE / SPECIALTY LAST John Daisy Reeves, MD Neurosurgery (Surgeon) 05/30/2024  Auston Reyes BIRCH, MD Primary Care Provider 04/11/2024  Florencio Shine, MD Cardiology 12/23/2023   Parris Manna, MD Pulmonary Medicine 06/07/2024   ALLERGIES: Allergies  Allergen Reactions   Latex Itching    CURRENT HOME MEDICATIONS: No current facility-administered medications for this encounter.    acetaminophen  (TYLENOL ) 500 MG tablet   amLODipine (NORVASC) 5 MG tablet   apixaban  (ELIQUIS ) 5 MG TABS tablet   atorvastatin (LIPITOR) 40 MG tablet   Cholecalciferol (VITAMIN D-3) 25 MCG (1000 UT) CAPS   Choline Fenofibrate 135 MG capsule   cyanocobalamin (VITAMIN B12) 100 MCG tablet   flecainide (TAMBOCOR) 50 MG tablet   Magnesium 250 MG TABS   metoprolol  tartrate (LOPRESSOR ) 25 MG tablet   VENTOLIN HFA 108 (90 Base) MCG/ACT  inhaler   HISTORY: Past Medical History:  Diagnosis Date   Aortic atherosclerosis    Aortic stenosis 03/15/2020   Arthritis    Ascending aorta dilatation 04/16/2020   Atrial fibrillation (HCC)    a.) CHA2DS2VASc = 4 (age x2, HTN, vascular disease  history);  b.) rate/rhythm maintained on oral flecainide + metoprolol  tartrate; chronically anticoagulated with apixaban    Bladder outlet obstruction    CAD (coronary artery disease) 09/10/2022   a.) cCTA 09/10/2022: Ca2+ 39.7 (11th %ile for age/sex/race matched control); < 25% pLAD   Cholelithiasis with choledocholithiasis    Colon polyps    DDD (degenerative disc disease), cervical    Diastolic dysfunction 02/25/2017   Diverticulosis    Dyspnea    ED (erectile dysfunction) of organic origin    GERD (gastroesophageal reflux disease)    Hydrocele, bilateral    Hyperlipidemia    Hypertension    LBBB (left bundle branch block)    Lumbar stenosis    a.) s/p lumbar laminectomy in 2008   Nephrolithiasis    On apixaban  therapy    Pneumonia    Prostate cancer (HCC)    a.) s/p retropubic prostatectomy 10/2005   Pulmonary fibrosis (HCC)    Spondylosis of lumbar spine    Status post bilateral cataract extraction    Tinnitus    Past Surgical History:  Procedure Laterality Date   CATARACT EXTRACTION Bilateral    COLONOSCOPY WITH PROPOFOL  N/A 08/01/2015   Procedure: COLONOSCOPY WITH PROPOFOL ;  Surgeon: Lamar ONEIDA Holmes, MD;  Location: [facility] ENDOSCOPY;  Service: Endoscopy;  Laterality: N/A;   COLONOSCOPY WITH PROPOFOL  N/A 01/22/2021   Procedure: COLONOSCOPY WITH PROPOFOL ;  Surgeon: Toledo, Ladell POUR, MD;  Location: [facility] ENDOSCOPY;  Service: Gastroenterology;  Laterality: N/A;   CYSTOSCOPY/URETEROSCOPY/HOLMIUM LASER/STENT PLACEMENT Right 02/26/2023   Procedure: CYSTOSCOPY/URETEROSCOPY/HOLMIUM LASER/STENT PLACEMENT;  Surgeon: Francisca Redell BROCKS, MD;  Location: [facility] ORS;  Service: Urology;  Laterality: Right;   HIP ARTHROPLASTY Left    HYDROCELE EXCISION Bilateral 02/26/2023   Procedure: HYDROCELECTOMY ADULT;  Surgeon: Francisca Redell BROCKS, MD;  Location: [facility] ORS;  Service: Urology;  Laterality: Bilateral;   LUMBAR LAMINECTOMY  2008   PERINEAL PROSTATECTOMY     pointer finger surgery Left 05/2019    POLYPECTOMY     TONSILLECTOMY     No family history on file. Social History   Tobacco Use   Smoking status: Former    Current packs/day: 0.00    Average packs/day: 2.0 packs/day for 30.0 years (60.0 ttl pk-yrs)    Types: Cigarettes    Start date: 07/20/1957    Quit date: 07/21/1987    Years since quitting: 36.9    Passive exposure: Past   Smokeless tobacco: Never  Substance Use Topics   Alcohol use: Yes    Alcohol/week: 1.0 standard drink of alcohol    Types: 1 Glasses of wine per week    Comment: 1-2 drinks per day   LABS:  Hospital Outpatient Visit on 06/13/2024  Component Date Value Ref Range Status   MRSA, PCR 06/13/2024 NEGATIVE  NEGATIVE Final   Staphylococcus aureus 06/13/2024 NEGATIVE  NEGATIVE Final   Comment: (NOTE) The Xpert SA Assay (FDA approved for NASAL specimens in patients 10 years of age and older), is one component of a comprehensive surveillance program. It is not intended to diagnose infection nor to guide or monitor treatment. Performed at [facility], [address] [place], [place] [address]    ABO/RH(D) 06/13/2024 O POS   Final   Antibody  Screen 06/13/2024 NEG   Final   Sample Expiration 06/13/2024 06/27/2024,2359   Final   Extend sample reason 06/13/2024    Final                   Value:NO TRANSFUSIONS OR PREGNANCY IN THE PAST 3 MONTHS Performed at [facility], [address] [place], [place] [address]    Color, Urine 06/13/2024 YELLOW (A)  YELLOW Final   APPearance 06/13/2024 CLEAR (A)  CLEAR Final   Specific Gravity, Urine 06/13/2024 1.017  1.005 - 1.030 Final   pH 06/13/2024 6.0  5.0 - 8.0 Final   Glucose, UA 06/13/2024 NEGATIVE  NEGATIVE mg/dL Final   Hgb urine dipstick 06/13/2024 NEGATIVE  NEGATIVE Final   Bilirubin Urine 06/13/2024 NEGATIVE  NEGATIVE Final   Ketones, ur 06/13/2024 NEGATIVE  NEGATIVE mg/dL Final   Protein, ur 88/74/7974 NEGATIVE  NEGATIVE mg/dL Final   Nitrite 88/74/7974 NEGATIVE  NEGATIVE Final    Leukocytes,Ua 06/13/2024 NEGATIVE  NEGATIVE Final   RBC / HPF 06/13/2024 0-5  0 - 5 RBC/hpf Final   WBC, UA 06/13/2024 0-5  0 - 5 WBC/hpf Final   Bacteria, UA 06/13/2024 NONE SEEN  NONE SEEN Final   Squamous Epithelial / HPF 06/13/2024 0-5  0 - 5 /HPF Final   Mucus 06/13/2024 PRESENT   Final   Performed at [facility], [address] [place], [place] [address]  Admission on 06/08/2024, Discharged on 06/08/2024  Component Date Value Ref Range Status   Sodium 06/08/2024 139  135 - 145 mmol/L Final   Potassium 06/08/2024 4.1  3.5 - 5.1 mmol/L Final   Chloride 06/08/2024 102  98 - 111 mmol/L Final   CO2 06/08/2024 27  22 - 32 mmol/L Final   Glucose, Bld 06/08/2024 114 (H)  70 - 99 mg/dL Final   Glucose reference range applies only to samples taken after fasting for at least 8 hours.   BUN 06/08/2024 15  8 - 23 mg/dL Final   Creatinine, Ser 06/08/2024 1.25 (H)  0.61 - 1.24 mg/dL Final   Calcium 88/79/7974 9.3  8.9 - 10.3 mg/dL Final   GFR, Estimated 06/08/2024 57 (L)  >60 mL/min Final   Comment: (NOTE) Calculated using the CKD-EPI Creatinine Equation (2021)    Anion gap 06/08/2024 11  5 - 15 Final   Performed at [facility], [address] [place], [place] [address]   WBC 06/08/2024 6.7  4.0 - 10.5 K/uL Final   RBC 06/08/2024 4.42  4.22 - 5.81 MIL/uL Final   Hemoglobin 06/08/2024 13.5  13.0 - 17.0 g/dL Final   HCT 88/79/7974 40.6  39.0 - 52.0 % Final   MCV 06/08/2024 91.9  80.0 - 100.0 fL Final   MCH 06/08/2024 30.5  26.0 - 34.0 pg Final   MCHC 06/08/2024 33.3  30.0 - 36.0 g/dL Final   RDW 88/79/7974 14.2  11.5 - 15.5 % Final   Platelets 06/08/2024 262  150 - 400 K/uL Final   nRBC 06/08/2024 0.0  0.0 - 0.2 % Final   Performed at [facility], [address] [place], [place] [address]    ECG: Date: 06/08/2024  Time ECG obtained: 1623 PM Rate: 58 bpm Rhythm: Sinus bradycardia with first-degree AV block Axis (leads I and aVF): left Intervals:  PR 218 ms. QRS 138 ms. QTc 428 ms. ST segment and T wave changes: LVH with QRS widening and repolarization abnormalities Evidence of a possible, age undetermined, prior infarct:  No Comparison: Similar to previous tracing obtained on 02/19/2023   IMAGING / PROCEDURES: CT ANGIO CHEST/ABD/PEL FOR DISSECTION W AND/OR WO CONTRAST performed on 06/08/2024 No evidence for aortic dissection or aneurysm. Stable interstitial fibrotic changes in the lungs. Cholelithiasis. Sigmoid colon diverticulosis Aortic atherosclerosis.   PULMONARY FUNCTION TESTING performed on 06/07/2024 FVC was 3.05 L, 76 % of predicted  FEV1 was 2.51 L, 85 % of predicted  FEV1/FVC ratio was  111 % of predicted  FEF 25-75% liters per second was 147 % of predicted  TLC was 61 % of predicted  RV was 48 % of predicted  DLCO was 62 % of predicted  DLCO/VA was 90 % of predicted  Good patient effort with good repeatability.   TRANSTHORACIC ECHOCARDIOGRAM performed on 06/10/2023 Normal left ventricular systolic function with an EF of >55% Moderate LVH Left ventricular diastolic Doppler parameters consistent with abnormal relaxation (G1DD). Normal right ventricular size and function Mild pan valvular regurgitation Mild aortic valve stenosis with a mean transvalvular pressure gradient = 18 mmHg; AVA (VTI) = 0.6 cm; DI = 0.36 Remaining transvalvular gradients normal; no valvular stenosis No pericardial effusion  MR LUMBAR SPINE WO CONTRAST performed on 05/04/2024 Multilevel degenerative changes of the lumbar spine are not significantly progressed from prior study. Mild canal stenosis at L2-3 and L4-5. Severe foraminal stenosis on the right at L4-5 and on the left at L5-S1. Moderate-severe right foraminal stenosis at L5-S1. Reactive subchondral marrow signal changes associated with the L5-S1 facet joints, more pronounced on the right. Small right L5-S1 facet joint effusion. This may be a focal source of pain.   CT CORONARY  MORPH W/CTA COR W/SCORE W/CA W/CM &/OR WO/CM performed on 09/10/2022 Progressive atelectasis/scarring in both lungs. Moderate bronchitic changes, most pronounced in the right lower lobe. Changes of COPD. Coronary calcium score of 39.7. This was 11th percentile for age and sex matched control. Normal coronary origin with right dominance. Minimal proximal LAD stenosis (<25%). CAD-RADS 1. Minimal non-obstructive CAD (0-24%). Consider non-atherosclerotic causes of dyspnea. Consider preventive therapy and risk factor modification.   IMPRESSION AND PLAN: John Marks has been referred for pre-anesthesia review and clearance prior to him undergoing the planned anesthetic and procedural courses. Available labs, pertinent testing, and imaging results were personally reviewed by me in preparation for upcoming operative/procedural course. [facility] medical record has been updated following extensive record review and patient interview with PAT staff.   This patient has been appropriately cleared by cardiology with an overall LOW risk of patient experiencing significant perioperative cardiovascular complications. here at [facility]. Based on clinical review performed today (06/21/24), barring any significant acute changes in the patient's overall condition, it is anticipated that he will be able to proceed with the planned surgical intervention. Any acute changes in clinical condition may necessitate his procedure being postponed and/or cancelled. Patient will meet with anesthesia team (MD and/or CRNA) on the day of his procedure for preoperative evaluation/assessment. Questions regarding anesthetic course will be fielded at that time.   Pre-surgical instructions were reviewed with the patient during his PAT appointment, and questions were fielded to satisfaction by PAT clinical staff. He has been instructed on which medications that he will need to hold prior to surgery, as  well as the ones that have been deemed safe/appropriate to take on the day of his procedure. As part of the general education provided by PAT, patient made aware both verbally and in writing, that he would need to abstain  from the use of any illegal substances during his perioperative course. He was advised that failure to follow the provided instructions could necessitate case cancellation or result in serious perioperative complications up to and including death. Patient encouraged to contact PAT and/or his surgeon's office to discuss any questions or concerns that may arise prior to surgery; verbalized understanding.   Dorise Pereyra, MSN, APRN, FNP-C, CEN [facility]  Perioperative Services Nurse Practitioner Phone: [phone] Fax: [phone] 06/21/24 10:38 AM  NOTE: This note has been prepared using Dragon dictation software. Despite my best ability to proofread, there is always the potential that unintentional transcriptional errors may still occur from this process.

## 2024-06-25 MED ORDER — VANCOMYCIN HCL IN DEXTROSE 1-5 GM/200ML-% IV SOLN
1000.0000 mg | Freq: Once | INTRAVENOUS | Status: AC
  Administered 2024-06-26: 1000 mg via INTRAVENOUS

## 2024-06-25 MED ORDER — LACTATED RINGERS IV SOLN: INTRAVENOUS | Status: DC

## 2024-06-25 MED ORDER — ORAL CARE MOUTH RINSE: 15.0000 mL | Freq: Once | OROMUCOSAL | Status: AC

## 2024-06-25 MED ORDER — CHLORHEXIDINE GLUCONATE 0.12 % MT SOLN
15.0000 mL | Freq: Once | OROMUCOSAL | Status: AC
  Administered 2024-06-26: 15 mL via OROMUCOSAL

## 2024-06-26 ENCOUNTER — Encounter: Admission: RE | Disposition: A | Payer: Self-pay | Source: Home / Self Care | Attending: Neurosurgery

## 2024-06-26 ENCOUNTER — Inpatient Hospital Stay
Admission: RE | Admit: 2024-06-26 | Discharge: 2024-06-28 | DRG: 428 | Disposition: A | Attending: Neurosurgery | Admitting: Neurosurgery

## 2024-06-26 ENCOUNTER — Encounter: Payer: Self-pay | Admitting: Neurosurgery

## 2024-06-26 ENCOUNTER — Inpatient Hospital Stay: Payer: Self-pay | Admitting: Urgent Care

## 2024-06-26 ENCOUNTER — Other Ambulatory Visit: Payer: Self-pay

## 2024-06-26 ENCOUNTER — Inpatient Hospital Stay

## 2024-06-26 DIAGNOSIS — M5442 Lumbago with sciatica, left side: Secondary | ICD-10-CM

## 2024-06-26 DIAGNOSIS — Z01818 Encounter for other preprocedural examination: Secondary | ICD-10-CM

## 2024-06-26 DIAGNOSIS — M5441 Lumbago with sciatica, right side: Secondary | ICD-10-CM | POA: Diagnosis not present

## 2024-06-26 DIAGNOSIS — G8929 Other chronic pain: Secondary | ICD-10-CM

## 2024-06-26 DIAGNOSIS — M47817 Spondylosis without myelopathy or radiculopathy, lumbosacral region: Secondary | ICD-10-CM | POA: Diagnosis not present

## 2024-06-26 DIAGNOSIS — Z981 Arthrodesis status: Principal | ICD-10-CM

## 2024-06-26 HISTORY — PX: ANTERIOR LAT LUMBAR FUSION: SHX1168

## 2024-06-26 HISTORY — DX: Cataract extraction status, left eye: Z98.41

## 2024-06-26 HISTORY — DX: Gastro-esophageal reflux disease without esophagitis: K21.9

## 2024-06-26 HISTORY — DX: Long term (current) use of anticoagulants: Z79.01

## 2024-06-26 HISTORY — PX: APPLICATION OF INTRAOPERATIVE CT SCAN: SHX6668

## 2024-06-26 HISTORY — DX: Cataract extraction status, right eye: Z98.41

## 2024-06-26 LAB — ABO/RH: ABO/RH(D): O POS

## 2024-06-26 SURGERY — ANTERIOR LATERAL LUMBAR FUSION 1 LEVEL
Anesthesia: General | Site: Spine Lumbar

## 2024-06-26 MED ORDER — METOPROLOL TARTRATE 25 MG PO TABS
12.5000 mg | ORAL_TABLET | Freq: Two times a day (BID) | ORAL | Status: DC
  Administered 2024-06-26 – 2024-06-28 (×4): 12.5 mg via ORAL
  Filled 2024-06-26 (×4): qty 1

## 2024-06-26 MED ORDER — LIDOCAINE HCL (CARDIAC) PF 100 MG/5ML IV SOSY
PREFILLED_SYRINGE | INTRAVENOUS | Status: DC | PRN
  Administered 2024-06-26: 100 mg via INTRAVENOUS

## 2024-06-26 MED ORDER — BUPIVACAINE HCL (PF) 0.5 % IJ SOLN
INTRAMUSCULAR | Status: AC
  Filled 2024-06-26: qty 60

## 2024-06-26 MED ORDER — MAGNESIUM OXIDE -MG SUPPLEMENT 400 (240 MG) MG PO TABS
400.0000 mg | ORAL_TABLET | Freq: Every day | ORAL | Status: DC
  Administered 2024-06-26 – 2024-06-28 (×3): 400 mg via ORAL
  Filled 2024-06-26 (×3): qty 1

## 2024-06-26 MED ORDER — FENTANYL CITRATE (PF) 100 MCG/2ML IJ SOLN
25.0000 ug | INTRAMUSCULAR | Status: DC | PRN
  Administered 2024-06-26: 25 ug via INTRAVENOUS

## 2024-06-26 MED ORDER — ENOXAPARIN SODIUM 40 MG/0.4ML IJ SOSY
40.0000 mg | PREFILLED_SYRINGE | INTRAMUSCULAR | Status: DC
  Administered 2024-06-27 – 2024-06-28 (×2): 40 mg via SUBCUTANEOUS
  Filled 2024-06-26 (×2): qty 0.4

## 2024-06-26 MED ORDER — REMIFENTANIL HCL 1 MG IV SOLR
INTRAVENOUS | Status: AC
  Filled 2024-06-26: qty 1000

## 2024-06-26 MED ORDER — VANCOMYCIN HCL IN DEXTROSE 1-5 GM/200ML-% IV SOLN
INTRAVENOUS | Status: AC
  Filled 2024-06-26: qty 200

## 2024-06-26 MED ORDER — AMLODIPINE BESYLATE 5 MG PO TABS
5.0000 mg | ORAL_TABLET | Freq: Two times a day (BID) | ORAL | Status: DC
  Administered 2024-06-26 – 2024-06-28 (×4): 5 mg via ORAL
  Filled 2024-06-26 (×4): qty 1

## 2024-06-26 MED ORDER — ACETAMINOPHEN 325 MG PO TABS: 650.0000 mg | ORAL_TABLET | ORAL | Status: DC | PRN

## 2024-06-26 MED ORDER — BUPIVACAINE LIPOSOME 1.3 % IJ SUSP
INTRAMUSCULAR | Status: AC
  Filled 2024-06-26: qty 20

## 2024-06-26 MED ORDER — ACETAMINOPHEN 500 MG PO TABS
1000.0000 mg | ORAL_TABLET | Freq: Four times a day (QID) | ORAL | Status: DC
  Administered 2024-06-26 – 2024-06-28 (×6): 1000 mg via ORAL
  Filled 2024-06-26 (×6): qty 2

## 2024-06-26 MED ORDER — ALBUMIN HUMAN 5 % IV SOLN
INTRAVENOUS | Status: AC
  Filled 2024-06-26: qty 250

## 2024-06-26 MED ORDER — FENOFIBRATE 160 MG PO TABS
160.0000 mg | ORAL_TABLET | Freq: Every day | ORAL | Status: DC
  Administered 2024-06-27 – 2024-06-28 (×2): 160 mg via ORAL
  Filled 2024-06-26 (×2): qty 1

## 2024-06-26 MED ORDER — ACETAMINOPHEN 650 MG RE SUPP: 650.0000 mg | RECTAL | Status: DC | PRN

## 2024-06-26 MED ORDER — SODIUM CHLORIDE (PF) 0.9 % IJ SOLN
INTRAMUSCULAR | Status: AC
  Filled 2024-06-26: qty 20

## 2024-06-26 MED ORDER — CEFAZOLIN SODIUM-DEXTROSE 2-4 GM/100ML-% IV SOLN
INTRAVENOUS | Status: AC
  Filled 2024-06-26: qty 100

## 2024-06-26 MED ORDER — ACETAMINOPHEN 10 MG/ML IV SOLN
INTRAVENOUS | Status: AC
  Filled 2024-06-26: qty 100

## 2024-06-26 MED ORDER — FENTANYL CITRATE (PF) 100 MCG/2ML IJ SOLN
INTRAMUSCULAR | Status: AC
  Filled 2024-06-26: qty 2

## 2024-06-26 MED ORDER — 0.9 % SODIUM CHLORIDE (POUR BTL) OPTIME
TOPICAL | Status: DC | PRN
  Administered 2024-06-26 (×2): 500 mL

## 2024-06-26 MED ORDER — SODIUM CHLORIDE 0.9% FLUSH: 3.0000 mL | INTRAVENOUS | Status: DC | PRN

## 2024-06-26 MED ORDER — CEFAZOLIN SODIUM-DEXTROSE 2-4 GM/100ML-% IV SOLN
2.0000 g | Freq: Once | INTRAVENOUS | Status: AC
  Administered 2024-06-26: 2 g via INTRAVENOUS

## 2024-06-26 MED ORDER — PROPOFOL 1000 MG/100ML IV EMUL
INTRAVENOUS | Status: AC
  Filled 2024-06-26: qty 100

## 2024-06-26 MED ORDER — DEXAMETHASONE SOD PHOSPHATE PF 10 MG/ML IJ SOLN
INTRAMUSCULAR | Status: DC | PRN
  Administered 2024-06-26: 10 mg via INTRAVENOUS

## 2024-06-26 MED ORDER — PROPOFOL 10 MG/ML IV BOLUS
INTRAVENOUS | Status: AC
  Filled 2024-06-26: qty 40

## 2024-06-26 MED ORDER — PROPOFOL 10 MG/ML IV BOLUS
INTRAVENOUS | Status: DC | PRN
  Administered 2024-06-26: 150 mg via INTRAVENOUS
  Administered 2024-06-26 (×2): 30 mg via INTRAVENOUS

## 2024-06-26 MED ORDER — PROPOFOL 500 MG/50ML IV EMUL
INTRAVENOUS | Status: DC | PRN
  Administered 2024-06-26: 50 ug/kg/min via INTRAVENOUS

## 2024-06-26 MED ORDER — CALCIUM CARBONATE ANTACID 500 MG PO CHEW
1.0000 | CHEWABLE_TABLET | Freq: Four times a day (QID) | ORAL | Status: DC | PRN
  Administered 2024-06-26: 200 mg via ORAL
  Filled 2024-06-26: qty 1

## 2024-06-26 MED ORDER — CHLORHEXIDINE GLUCONATE 0.12 % MT SOLN
OROMUCOSAL | Status: AC
  Filled 2024-06-26: qty 15

## 2024-06-26 MED ORDER — ONDANSETRON HCL 4 MG/2ML IJ SOLN: 4.0000 mg | Freq: Four times a day (QID) | INTRAMUSCULAR | Status: DC | PRN

## 2024-06-26 MED ORDER — PHENYLEPHRINE HCL-NACL 20-0.9 MG/250ML-% IV SOLN
INTRAVENOUS | Status: DC | PRN
  Administered 2024-06-26: 20 ug/min via INTRAVENOUS

## 2024-06-26 MED ORDER — IRRISEPT - 450ML BOTTLE WITH 0.05% CHG IN STERILE WATER, USP 99.95% OPTIME
TOPICAL | Status: DC | PRN
  Administered 2024-06-26: 450 mL via TOPICAL

## 2024-06-26 MED ORDER — SENNA 8.6 MG PO TABS
1.0000 | ORAL_TABLET | Freq: Two times a day (BID) | ORAL | Status: DC
  Administered 2024-06-26 – 2024-06-27 (×4): 8.6 mg via ORAL
  Filled 2024-06-26 (×5): qty 1

## 2024-06-26 MED ORDER — MAGNESIUM CITRATE PO SOLN: 1.0000 | Freq: Once | ORAL | Status: DC | PRN

## 2024-06-26 MED ORDER — ONDANSETRON HCL 4 MG PO TABS: 4.0000 mg | ORAL_TABLET | Freq: Four times a day (QID) | ORAL | Status: DC | PRN

## 2024-06-26 MED ORDER — OXYCODONE HCL 5 MG/5ML PO SOLN: 5.0000 mg | Freq: Once | ORAL | Status: AC | PRN

## 2024-06-26 MED ORDER — MENTHOL 3 MG MT LOZG: 1.0000 | LOZENGE | OROMUCOSAL | Status: DC | PRN

## 2024-06-26 MED ORDER — EPHEDRINE SULFATE-NACL 50-0.9 MG/10ML-% IV SOSY
PREFILLED_SYRINGE | INTRAVENOUS | Status: DC | PRN
  Administered 2024-06-26 (×2): 5 mg via INTRAVENOUS

## 2024-06-26 MED ORDER — PROPOFOL 10 MG/ML IV BOLUS
INTRAVENOUS | Status: AC
  Filled 2024-06-26: qty 20

## 2024-06-26 MED ORDER — VANCOMYCIN HCL 1 G IV SOLR
INTRAVENOUS | Status: DC | PRN
  Administered 2024-06-26: 1000 mg

## 2024-06-26 MED ORDER — SODIUM CHLORIDE 0.9 % IV SOLN: 250.0000 mL | INTRAVENOUS | Status: AC

## 2024-06-26 MED ORDER — SODIUM CHLORIDE 0.9% FLUSH
3.0000 mL | Freq: Two times a day (BID) | INTRAVENOUS | Status: DC
  Administered 2024-06-26 – 2024-06-27 (×4): 3 mL via INTRAVENOUS

## 2024-06-26 MED ORDER — SODIUM CHLORIDE (PF) 0.9 % IJ SOLN
INTRAMUSCULAR | Status: DC | PRN
  Administered 2024-06-26: 60 mL

## 2024-06-26 MED ORDER — ALBUTEROL SULFATE (2.5 MG/3ML) 0.083% IN NEBU: 2.5000 mL | INHALATION_SOLUTION | Freq: Four times a day (QID) | RESPIRATORY_TRACT | Status: DC | PRN

## 2024-06-26 MED ORDER — METHOCARBAMOL 1000 MG/10ML IJ SOLN: 500.0000 mg | Freq: Four times a day (QID) | INTRAMUSCULAR | Status: DC | PRN

## 2024-06-26 MED ORDER — SORBITOL 70 % SOLN: 30.0000 mL | Freq: Every day | Status: DC | PRN

## 2024-06-26 MED ORDER — FENTANYL CITRATE (PF) 100 MCG/2ML IJ SOLN
INTRAMUSCULAR | Status: DC | PRN
  Administered 2024-06-26 (×2): 50 ug via INTRAVENOUS

## 2024-06-26 MED ORDER — ONDANSETRON HCL 4 MG/2ML IJ SOLN
INTRAMUSCULAR | Status: DC | PRN
  Administered 2024-06-26: 4 mg via INTRAVENOUS

## 2024-06-26 MED ORDER — HYDROMORPHONE HCL 1 MG/ML IJ SOLN: 0.5000 mg | INTRAMUSCULAR | Status: AC | PRN

## 2024-06-26 MED ORDER — DOCUSATE SODIUM 100 MG PO CAPS
100.0000 mg | ORAL_CAPSULE | Freq: Two times a day (BID) | ORAL | Status: DC
  Administered 2024-06-26 – 2024-06-27 (×4): 100 mg via ORAL
  Filled 2024-06-26 (×5): qty 1

## 2024-06-26 MED ORDER — OXYCODONE HCL 5 MG PO TABS
ORAL_TABLET | ORAL | Status: AC
  Filled 2024-06-26: qty 1

## 2024-06-26 MED ORDER — OXYCODONE HCL 5 MG PO TABS
5.0000 mg | ORAL_TABLET | Freq: Once | ORAL | Status: AC | PRN
  Administered 2024-06-26: 5 mg via ORAL

## 2024-06-26 MED ORDER — POLYETHYLENE GLYCOL 3350 17 G PO PACK
17.0000 g | PACK | Freq: Every day | ORAL | Status: DC | PRN
  Administered 2024-06-27: 17 g via ORAL
  Filled 2024-06-26: qty 1

## 2024-06-26 MED ORDER — PHENOL 1.4 % MT LIQD: 1.0000 | OROMUCOSAL | Status: DC | PRN

## 2024-06-26 MED ORDER — FLECAINIDE ACETATE 50 MG PO TABS
50.0000 mg | ORAL_TABLET | Freq: Two times a day (BID) | ORAL | Status: DC
  Administered 2024-06-26 – 2024-06-28 (×4): 50 mg via ORAL
  Filled 2024-06-26 (×5): qty 1

## 2024-06-26 MED ORDER — LACTATED RINGERS IV SOLN: INTRAVENOUS | Status: DC | PRN

## 2024-06-26 MED ORDER — ATORVASTATIN CALCIUM 20 MG PO TABS
40.0000 mg | ORAL_TABLET | Freq: Every day | ORAL | Status: DC
  Administered 2024-06-26 – 2024-06-28 (×3): 40 mg via ORAL
  Filled 2024-06-26 (×3): qty 2

## 2024-06-26 MED ORDER — BUPIVACAINE-EPINEPHRINE 0.5% -1:200000 IJ SOLN
INTRAMUSCULAR | Status: DC | PRN
  Administered 2024-06-26: 6 mL
  Administered 2024-06-26: 8.5 mL

## 2024-06-26 MED ORDER — ALBUMIN HUMAN 5 % IV SOLN: INTRAVENOUS | Status: DC | PRN

## 2024-06-26 MED ORDER — SURGIFLO WITH THROMBIN (HEMOSTATIC MATRIX KIT) OPTIME
TOPICAL | Status: DC | PRN
  Administered 2024-06-26: 1 via TOPICAL

## 2024-06-26 MED ORDER — SODIUM CHLORIDE 0.9 % IV SOLN
INTRAVENOUS | Status: DC | PRN
  Administered 2024-06-26: .3 ug/kg/min via INTRAVENOUS

## 2024-06-26 MED ORDER — OXYCODONE HCL 5 MG PO TABS: 5.0000 mg | ORAL_TABLET | ORAL | Status: DC | PRN

## 2024-06-26 MED ORDER — OXYCODONE HCL 5 MG PO TABS: 10.0000 mg | ORAL_TABLET | ORAL | Status: DC | PRN

## 2024-06-26 MED ORDER — ACETAMINOPHEN 10 MG/ML IV SOLN
INTRAVENOUS | Status: DC | PRN
  Administered 2024-06-26: 1000 mg via INTRAVENOUS

## 2024-06-26 MED ORDER — HYDROMORPHONE HCL 1 MG/ML IJ SOLN
INTRAMUSCULAR | Status: DC | PRN
  Administered 2024-06-26: .5 mg via INTRAVENOUS

## 2024-06-26 MED ORDER — BUPIVACAINE-EPINEPHRINE (PF) 0.5% -1:200000 IJ SOLN
INTRAMUSCULAR | Status: AC
  Filled 2024-06-26: qty 20

## 2024-06-26 MED ORDER — SUCCINYLCHOLINE CHLORIDE 200 MG/10ML IV SOSY
PREFILLED_SYRINGE | INTRAVENOUS | Status: DC | PRN
  Administered 2024-06-26: 100 mg via INTRAVENOUS

## 2024-06-26 MED ORDER — HYDROMORPHONE HCL 1 MG/ML IJ SOLN
INTRAMUSCULAR | Status: AC
  Filled 2024-06-26: qty 1

## 2024-06-26 MED ORDER — CALCIUM CARBONATE ANTACID 500 MG PO CHEW
400.0000 mg | CHEWABLE_TABLET | Freq: Three times a day (TID) | ORAL | Status: DC
  Administered 2024-06-26 – 2024-06-28 (×3): 400 mg via ORAL
  Filled 2024-06-26 (×6): qty 2

## 2024-06-26 MED ORDER — GLYCOPYRROLATE 0.2 MG/ML IJ SOLN
INTRAMUSCULAR | Status: DC | PRN
  Administered 2024-06-26: .2 mg via INTRAVENOUS

## 2024-06-26 MED ORDER — SODIUM CHLORIDE (PF) 0.9 % IJ SOLN
INTRAMUSCULAR | Status: AC
  Filled 2024-06-26: qty 40

## 2024-06-26 MED ORDER — METHOCARBAMOL 500 MG PO TABS: 500.0000 mg | ORAL_TABLET | Freq: Four times a day (QID) | ORAL | Status: DC | PRN

## 2024-06-26 SURGICAL SUPPLY — 75 items
ALLOGRAFT BONESTRIP KORE 2.5X5 (Bone Implant) IMPLANT
BASIN KIT SINGLE STR (MISCELLANEOUS) ×1 IMPLANT
BUR NEURO DRILL SOFT 3.0X3.8M (BURR) ×1 IMPLANT
CHLORAPREP W/TINT 26 (MISCELLANEOUS) ×2 IMPLANT
CNTNR URN SCR LID CUP LEK RST (MISCELLANEOUS) IMPLANT
CORD BIP STRL DISP 12FT (MISCELLANEOUS) ×1 IMPLANT
CORD LIGHT LATERIAL X LIFT (MISCELLANEOUS) IMPLANT
DERMABOND ADVANCED .7 DNX12 (GAUZE/BANDAGES/DRESSINGS) ×3 IMPLANT
DRAPE C ARM PK CFD 31 SPINE (DRAPES) ×1 IMPLANT
DRAPE C-ARMOR (DRAPES) ×1 IMPLANT
DRAPE HD 5FT BACK TABLE (DRAPES) ×1 IMPLANT
DRAPE INCISE IOBAN 66X45 STRL (DRAPES) ×1 IMPLANT
DRAPE LAPAROTOMY 100X77 ABD (DRAPES) ×2 IMPLANT
DRAPE POUCH INSTRU U-SHP 10X18 (DRAPES) ×1 IMPLANT
DRAPE SCAN PATIENT (DRAPES) ×1 IMPLANT
DRAPE SPINE LEICA/WILD 54X150 (DRAPES) ×1 IMPLANT
DRAPE TABLE BACK 80X90 (DRAPES) ×1 IMPLANT
DRSG OPSITE POSTOP 3X4 (GAUZE/BANDAGES/DRESSINGS) ×1 IMPLANT
DRSG OPSITE POSTOP 4X10 (GAUZE/BANDAGES/DRESSINGS) IMPLANT
DRSG OPSITE POSTOP 4X6 (GAUZE/BANDAGES/DRESSINGS) IMPLANT
DRSG TEGADERM 2-3/8X2-3/4 SM (GAUZE/BANDAGES/DRESSINGS) IMPLANT
DRSG TEGADERM 4X4.75 (GAUZE/BANDAGES/DRESSINGS) ×1 IMPLANT
DRSG TEGADERM 6X8 (GAUZE/BANDAGES/DRESSINGS) IMPLANT
ELECTRODE EZSTD 165MM 6.5IN (MISCELLANEOUS) ×1 IMPLANT
ELECTRODE REM PT RTRN 9FT ADLT (ELECTROSURGICAL) ×2 IMPLANT
EVACUATOR 1/8 PVC DRAIN (DRAIN) IMPLANT
EX-PIN ORTHOLOCK NAV 4X150 (PIN) ×1 IMPLANT
FEE CVG SUPP BRAINLAB NG SPNE (MISCELLANEOUS) ×1 IMPLANT
FEE INTRAOP CADWELL SUPPLY NCS (MISCELLANEOUS) IMPLANT
FEE INTRAOP MONITOR IMPULS NCS (MISCELLANEOUS) IMPLANT
FORCEPS BPLR BAYO 10IN 1.0TIP (ORTHOPEDIC DISPOSABLE SUPPLIES) IMPLANT
GAUZE 4X4 16PLY ~~LOC~~+RFID DBL (SPONGE) IMPLANT
GAUZE SPONGE 2X2 STRL 8-PLY (GAUZE/BANDAGES/DRESSINGS) IMPLANT
GLOVE BIOGEL PI IND STRL 6.5 (GLOVE) ×3 IMPLANT
GLOVE SURG SYN 6.5 PF PI (GLOVE) ×5 IMPLANT
GLOVE SURG SYN 8.5 PF PI (GLOVE) ×6 IMPLANT
GOWN SRG LRG LVL 4 IMPRV REINF (GOWNS) ×2 IMPLANT
GOWN SRG XL LVL 3 NONREINFORCE (GOWNS) ×2 IMPLANT
HOLDER FOLEY CATH W/STRAP (MISCELLANEOUS) ×1 IMPLANT
KIT DILATOR XLIF 5 (KITS) IMPLANT
KIT MAXCESS (KITS) IMPLANT
KIT PREVENA INCISION MGT 13 (CANNISTER) IMPLANT
KIT SPINAL PRONEVIEW (KITS) ×1 IMPLANT
KIT TURNOVER KIT A (KITS) ×1 IMPLANT
LAVAGE JET IRRISEPT WOUND (IRRIGATION / IRRIGATOR) ×1 IMPLANT
MANIFOLD NEPTUNE II (INSTRUMENTS) ×2 IMPLANT
MARKER SKIN DUAL TIP RULER LAB (MISCELLANEOUS) IMPLANT
MARKER SPHERE PSV REFLC 13MM (MARKER) ×7 IMPLANT
MODULE NVM5 NEXT GEN EMG (NEUROSURGERY SUPPLIES) IMPLANT
NDL SAFETY ECLIP 18X1.5 (MISCELLANEOUS) ×1 IMPLANT
NS IRRIG 500ML POUR BTL (IV SOLUTION) ×1 IMPLANT
PACK LAMINECTOMY ARMC (PACKS) ×1 IMPLANT
PAD ARMBOARD POSITIONER FOAM (MISCELLANEOUS) ×1 IMPLANT
PENCIL SMOKE EVACUATOR (MISCELLANEOUS) ×1 IMPLANT
ROD RELINE TI LORD 5.5X70 (Rod) IMPLANT
SCREW LOCK RELINE 5.5 TULIP (Screw) IMPLANT
SCREW RELINE-O 6.5X55 POLY (Screw) IMPLANT
SCREW RELINE-O POLY 6.5X50MM (Screw) IMPLANT
SOLN STERILE WATER 500 ML (IV SOLUTION) IMPLANT
SOLN STERILE WATER BTL 1000 ML (IV SOLUTION) IMPLANT
SPACER HEDRON 18X55X13 10D (Spacer) IMPLANT
STAPLER SKIN PROX 35W (STAPLE) IMPLANT
SURGIFLO W/THROMBIN 8M KIT (HEMOSTASIS) ×1 IMPLANT
SUT STRATA 3-0 15 PS-2 (SUTURE) ×3 IMPLANT
SUT VIC AB 0 CT1 27XCR 8 STRN (SUTURE) ×2 IMPLANT
SUT VIC AB 2-0 CT1 18 (SUTURE) ×2 IMPLANT
SUTURE EHLN 3-0 FS-10 30 BLK (SUTURE) IMPLANT
SYR 10ML LL (SYRINGE) ×1 IMPLANT
SYR 30ML LL (SYRINGE) ×2 IMPLANT
SYR 3ML LL SCALE MARK (SYRINGE) ×1 IMPLANT
TAPE CLOTH 3X10 WHT NS LF (GAUZE/BANDAGES/DRESSINGS) ×4 IMPLANT
TOWEL OR 17X26 4PK STRL BLUE (TOWEL DISPOSABLE) ×2 IMPLANT
TRAP FLUID SMOKE EVACUATOR (MISCELLANEOUS) ×1 IMPLANT
TRAY FOLEY SLVR 16FR LF STAT (SET/KITS/TRAYS/PACK) ×1 IMPLANT
TUBING CONNECTING 10 (TUBING) ×1 IMPLANT

## 2024-06-26 NOTE — Anesthesia Postprocedure Evaluation (Signed)
 Anesthesia Post Note  Patient: John Marks  Procedure(s) Performed: ANTERIOR LATERAL LUMBAR FUSION 1 LEVEL (Spine Lumbar) POSTERIOR LUMBAR FUSION 2 LEVEL (Spine Lumbar) APPLICATION OF INTRAOPERATIVE CT SCAN (Spine Lumbar) FACETECTOMY, SPINE, 1 LEVEL (Spine Lumbar)  Patient location during evaluation: PACU Anesthesia Type: General Level of consciousness: awake and alert Pain management: pain level controlled Vital Signs Assessment: post-procedure vital signs reviewed and stable Respiratory status: spontaneous breathing, nonlabored ventilation, respiratory function stable and patient connected to nasal cannula oxygen Cardiovascular status: blood pressure returned to baseline and stable Postop Assessment: no apparent nausea or vomiting Anesthetic complications: no   No notable events documented.   Last Vitals:  Vitals:   06/26/24 1330 06/26/24 1418  BP: 125/66 126/68  Pulse: (!) 54 (!) 58  Resp: 11 16  Temp:  36.5 C  SpO2: 100% 100%    Last Pain:  Vitals:   06/26/24 1330  TempSrc:   PainSc: 2                  Debby Mines

## 2024-06-26 NOTE — Anesthesia Preprocedure Evaluation (Signed)
 Anesthesia Evaluation  Patient identified by MRN, date of birth, ID band Patient awake    Reviewed: Allergy & Precautions, NPO status , Patient's Chart, lab work & pertinent test results  History of Anesthesia Complications Negative for: history of anesthetic complications  Airway Mallampati: III  TM Distance: >3 FB Neck ROM: Full    Dental  (+) Missing, Dental Advidsory Given   Pulmonary shortness of breath and with exertion, former smoker HX of ILD   Pulmonary exam normal breath sounds clear to auscultation       Cardiovascular hypertension, + CAD  + dysrhythmias (a fib on Eliquis ) Atrial Fibrillation + Valvular Problems/Murmurs AS  Rhythm:Regular Rate:Normal  ECG 02/19/23:  Sinus bradycardia Left axis deviation Left ventricular hypertrophy with QRS widening ( R in aVL , Cornell product )  Echo 03/15/20:  Normal Stress Echocardiogram NORMAL RIGHT VENTRICULAR SYSTOLIC FUNCTION MILD VALVULAR REGURGITATION  NO VALVULAR STENOSIS NOTED Resting EF: >55% (Est.) Post Stress EF: >55% (Est.) ECG Results: Normal Aortic: TRIVIAL AR Mitral: TRIVIAL MR Tricuspid: TRIVIAL TR     Neuro/Psych HOH negative neurological ROS  negative psych ROS   GI/Hepatic Neg liver ROS,GERD  Medicated and Controlled,,  Endo/Other  negative endocrine ROS    Renal/GU Renal disease     Musculoskeletal   Abdominal   Peds  Hematology negative hematology ROS (+)   Anesthesia Other Findings Echo from 11/24:  NORMAL LEFT VENTRICULAR SYSTOLIC FUNCTION WITH MODERATE LVH  ESTIMATED EF: >55%  NORMAL LA PRESSURES WITH DIASTOLIC DYSFUNCTION (GRADE 1)  NORMAL RIGHT VENTRICULAR SYSTOLIC FUNCTION  VALVULAR REGURGITATION: MILD AR, MILD MR, MILD PR, MILD TR  VALVULAR STENOSIS: MILD AS, No MS, No PS, No TS    Note from Dorise Pereyra:   John Marks is a 83 y.o. male who is submitted for pre-surgical anesthesia review and clearance prior to him  undergoing the above procedure.  Patient is a Former Smoker (60 pack years; quit 07/1987). Pertinent PMH includes: CAD, atrial fibrillation, aortic stenosis, diastolic dysfunction, ascending aorta dilatation, aortic atherosclerosis, LBBB, HTN, HLD, SOB, pulmonary fibrosis, GERD (no daily Tx), OA, cervical DDD, lumbar stenosis (s/p laminectomy), remote prostate cancer (s/p prostatectomy), BOO, nephrolithiasis,    Patient is followed by cardiology Philippe, MD). He was last seen in the cardiology clinic on 12/23/2023; notes reviewed. At the time of his clinic visit, patient doing well overall from a cardiovascular perspective. Patient denied any chest pain, shortness of breath, PND, orthopnea, palpitations, significant peripheral edema, weakness, fatigue, vertiginous symptoms, or presyncope/syncope. Patient with a past medical history significant for cardiovascular diagnoses. Documented physical exam was grossly benign, providing no evidence of acute exacerbation and/or decompensation of the patient's known cardiovascular conditions.    Cardiac CTA was performed on 09/10/2022 revealing an elevated coronary calcium  score of 39.7. This placed patient in the 11th percentile for age/sex/race matched control. Study suggestive of a minimal (<25%) stenosis in the proximal LAD. Patient with normal coronary origin and RIGHT-sided dominance.     Most recent TTE performed on 06/10/2023 revealed a normal left ventricular systolic function with an EF of >55%. There was moderate LVH.  There were no regional wall motion abnormalities. Left ventricular diastolic Doppler parameters consistent with abnormal relaxation (G1DD).  LEFT atrium mildly enlarged.  Right ventricular size and function normal with a TAPSE measuring 2.7 cm  (normal range >/= 1.6 cm).  RVSP = 25.0 mmHg.  There was mild pan valvular regurgitation.  Mild aortic valve stenosis was noted; mean transvalvular pressure gradient = 18  mmHg; AVA (VTI) = 0.6 cm; DI  = 0.36.  All remaining transvalvular gradients were noted to be normal providing no evidence of hemodynamically significant valvular stenosis. Aorta normal in size with no evidence of ectasia or aneurysmal dilatation.   Patient with an atrial fibrillation diagnosis; CHA2DS2-VASc Score = 4 (age x 2, HTN, vascular disease history). His rate and rhythm are currently being maintained on oral flecainide  + metoprolol  tartrate. He is chronically anticoagulated using apixaban ; reported to be compliant with therapy with no evidence or reports of GI bleeding.  Blood pressure reasonably controlled at 130/66 mmHg on currently prescribed beta-blocker (metoprolol  tartrate) and CCB (amlodipine ) therapies. He is on atorvastatin  + choline fenofibrate  therapy for his HLD diagnosis and further ASCVD prevention.  He is not diabetic. Patient does not have an OSAH diagnosis.  Patient makes efforts to remain active.  He exercises 3-4 times a week.  He is able to complete all of his ADLs/IADLs without cardiovascular limitation.  Per the [facility], patient able to achieve >4 METS of physical activity without experiencing any significant degree of angina/anginal equivalent symptoms.  No changes were made to his medication regimen.  Patient to follow-up with outpatient cardiology in 1 year or sooner if needed.    [name] is scheduled for an elective ANTERIOR LATERAL LUMBAR FUSION 1 LEVEL; POSTERIOR LUMBAR FUSION 2 LEVEL; APPLICATION OF INTRAOPERATIVE CT SCAN; FACETECTOMY, SPINE, 1 LEVEL on 06/26/2024 with Dr. Reeves Daisy, MD. Given patient's past medical history significant for cardiovascular diagnoses, presurgical cardiac clearance was sought by the PAT team. Per cardiology, this patient is optimized for surgery and may proceed with the planned procedural course with a LOW risk of significant perioperative cardiovascular complications.   Again, this patient is on daily oral anticoagulation therapy using a DOAC medication.  He  has been instructed on recommendations for holding his apixaban  for 3 days prior to his procedure with plans to restart as soon as postoperative bleeding risk felt to be minimized by his primary attending surgeon. The patient has been instructed that his last dose should be on 06/22/2024.     Past Medical History: No date: Aortic atherosclerosis 03/15/2020: Aortic stenosis No date: Arthritis 04/16/2020: Ascending aorta dilatation No date: Atrial fibrillation (HCC)     Comment:  a.) CHA2DS2VASc = 4 (age x2, HTN, vascular disease               history);  b.) rate/rhythm maintained on oral flecainide                + metoprolol  tartrate; chronically anticoagulated with               apixaban  No date: Bladder outlet obstruction 09/10/2022: CAD (coronary artery disease)     Comment:  a.) cCTA 09/10/2022: Ca2+ 39.7 (11th %ile for               age/sex/race matched control); < 25% pLAD No date: Cholelithiasis with choledocholithiasis No date: Colon polyps No date: DDD (degenerative disc disease), cervical 02/25/2017: Diastolic dysfunction No date: Diverticulosis No date: Dyspnea No date: ED (erectile dysfunction) of organic origin No date: GERD (gastroesophageal reflux disease) No date: Hydrocele, bilateral No date: Hyperlipidemia No date: Hypertension No date: LBBB (left bundle branch block) No date: Lumbar stenosis     Comment:  a.) s/p lumbar laminectomy in 2008 No date: Nephrolithiasis No date: On apixaban  therapy No date: Pneumonia No date: Prostate cancer [facility])     Comment:  a.) s/p retropubic prostatectomy 10/2005 No  date: Pulmonary fibrosis (HCC) No date: Spondylosis of lumbar spine No date: Status post bilateral cataract extraction No date: Tinnitus  Past Surgical History: No date: CATARACT EXTRACTION; Bilateral 08/01/2015: COLONOSCOPY WITH PROPOFOL ; N/A     Comment:  Procedure: COLONOSCOPY WITH PROPOFOL ;  Surgeon: Lamar ONEIDA Holmes, MD;  Location: [facility]  ENDOSCOPY;  Service:               Endoscopy;  Laterality: N/A; 01/22/2021: COLONOSCOPY WITH PROPOFOL ; N/A     Comment:  Procedure: COLONOSCOPY WITH PROPOFOL ;  Surgeon: [place],               Ladell POUR, MD;  Location: [facility] ENDOSCOPY;  Service:               Gastroenterology;  Laterality: N/A; 02/26/2023: CYSTOSCOPY/URETEROSCOPY/HOLMIUM LASER/STENT PLACEMENT;  Right     Comment:  Procedure: CYSTOSCOPY/URETEROSCOPY/HOLMIUM LASER/STENT               PLACEMENT;  Surgeon: Francisca Redell BROCKS, MD;  Location:               [facility] ORS;  Service: Urology;  Laterality: Right; No date: HIP ARTHROPLASTY; Left 02/26/2023: HYDROCELE EXCISION; Bilateral     Comment:  Procedure: HYDROCELECTOMY ADULT;  Surgeon: Francisca Redell BROCKS, MD;  Location: [facility] ORS;  Service: Urology;                Laterality: Bilateral; 2008: LUMBAR LAMINECTOMY No date: PERINEAL PROSTATECTOMY 05/2019: pointer finger surgery; Left No date: POLYPECTOMY No date: TONSILLECTOMY  BMI    Body Mass Index: 28.05 kg/m      Reproductive/Obstetrics negative OB ROS                              Anesthesia Physical Anesthesia Plan  ASA: 3  Anesthesia Plan: General ETT   Post-op Pain Management:    Induction: Intravenous  PONV Risk Score and Plan: 2 and Ondansetron , Dexamethasone , Propofol  infusion and TIVA  Airway Management Planned: Oral ETT  Additional Equipment:   Intra-op Plan:   Post-operative Plan: Extubation in OR  Informed Consent: I have reviewed the patients History and Physical, chart, labs and discussed the procedure including the risks, benefits and alternatives for the proposed anesthesia with the patient or authorized representative who has indicated his/her understanding and acceptance.     Dental Advisory Given  Plan Discussed with: Anesthesiologist, CRNA and Surgeon  Anesthesia Plan Comments: (Patient consented for risks of anesthesia including but not limited to:   - adverse reactions to medications - damage to eyes, teeth, lips or other oral mucosa - nerve damage due to positioning  - sore throat or hoarseness - Damage to heart, brain, nerves, lungs, other parts of body or loss of life  Patient voiced understanding and assent.)        Anesthesia Quick Evaluation

## 2024-06-26 NOTE — Op Note (Signed)
 Indications: Mr. Archimedes Harold is a 83 y.o. male with M54.42, M54.41, G89.29 Chronic bilateral low back pain with bilateral sciatica, M47.817 Lumbosacral spondylosis without myelopathy.  He failed conservative management prompting surgical intervention  Findings: expansion of disc space  Preoperative Diagnosis: M54.42, M54.41, G89.29 Chronic bilateral low back pain with bilateral sciatica, M47.817 Lumbosacral spondylosis without myelopathy  Postoperative Diagnosis: same   EBL: 200 ml IVF: see anesthesia record Drains: one Disposition: Extubated and Stable to PACU Complications: none  A foley catheter was placed.   Preoperative Note:   Risks of surgery discussed include: infection, bleeding, stroke, coma, death, paralysis, CSF leak, nerve/spinal cord injury, numbness, tingling, weakness, complex regional pain syndrome, recurrent stenosis and/or disc herniation, vascular injury, development of instability, neck/back pain, need for further surgery, persistent symptoms, development of deformity, and the risks of anesthesia. The patient understood these risks and agreed to proceed.  NAME OF ANTERIOR PROCEDURE:               1. Anterior lumbar interbody fusion via a right lateral retroperitoneal approach at L4/5 2. Placement of a Lordotic Globus Hedron interbody cage, filled with Demineralized Bone Matrix  NAME OF POSTERIOR PROCEDURE 1. Posterior instrumentation using Nuvasive Reline Instrumentation 2. Posterolateral fusion, L4 to S1, utilizing demineralized bone matrix 3. Use of Stereotaxis 4.  L5-S1 decompression including bilateral foraminotomies   PROCEDURE:  Patient was brought to the operating room, intubated, turned to the lateral position.  All pressure points were checked and double-checked.  The patient was prepped and draped in the standard fashion. Prior to prepping, fluoroscopy was brought in and the patient was positioned with a large bump under the contralateral side between  the iliac crest and rib cage, allowing the area between the iliac crest and the lateral aspect of the rib cage to open and increase the ability to reach inferiorly, to facilitate entry into the disc space.  The incision was marked upon the skin both the location of the disc space as well as the superior most aspect of the iliac crest.  Based on the identification of the disc space an incision was prepared, marked upon the skin and eventually was used for our lateral incision.  The fluoroscopy was turned into a cross table A/P image in order to confirm that the patient's spine remained in a perpendicular trajectory to the floor without rotation.  Once confirming that all the pressure points were checked and double-checked and the patient remained in sturdy position strapped down in this slightly jack-knifed lateral position, the patient was prepped and draped in standard fashion.  The skin was injected with local anesthetic, then incised until the abdominal wall fascia was noted.  I bluntly dissected posteriorly until we were able to identify the posterior musculature near petit's triangle.  At this point, using primarily blunt dissection with our finger aided with a metzenbaum scissor, were able to enter the retroperitoneal cavity.  The retroperitoneal potential space was opened further until palpating out the psoas muscle, the medial aspect of the iliac crest, the medial aspect of the last rib and continued to define the retroperitoneal space with blunt dissection in order to facilitate safe placement of our dilators.    While protecting by dissecting directly onto a finger in the retroperitoneum, the retroperitoneal space was entered safely from the lateral incision and the initial dilator placed onto the muscle belly of the psoas.  While directly stimulating the dilator and after radiographically confirming our location relative to the disc space, I  placed the dilator through the psoas.  The dilators were  stimulated to ensure remaining safely away from any of the lumbar plexus nerves; the dilators were repositioned until no pathologic stimulation was appreciated.  Once I had confirmed the location of our initial dilator radiographically, a K-wire secured the dilator into the L4/5 disc space and confirmed position under A/P and lateral fluoroscopy.  At this point, I dilated up with direct stimulation to confirm lack of pathologic stimulation.  Once all the dilators were in position, I placed in the retractor and secured it onto the table, locked into position and confirmed under A/P and lateral fluoroscopy to confirm our approach angle to the disc space as well as location relative to the disc space.  I then placed the muscle stimulator in through the working channel down to the vertebral body, stimulating the entire lateral surface of the vertebral body and any of the visualized psoas muscle that was adjacent to the retractor, confirming again the safe passage to the psoas before we began performing the discectomy.  At this point, we began our discectomy at L4/5.  The disc was incised laterally throughout the extent of our exposure. Using a combination of pituitary rongeurs, Kerrison rongeurs, rasps, curettes of various sorts, we were able to begin to clean out the disc space.  Once we had cleaned out the majority of the disc space, we then cut the lateral annulus with a cob, breaking the lateral annual attachments on the contralateral side by subtly working the cob through the annulus while using flouroscopy.  Care was taken not to extend further than required after cutting the annular attachments.  After this had been performed, we prepared the endplates for placement of our graft, sized a graft to the disc space by serially dilating up in trial sizes until we confirmed that our graft would be well positioned, allowing distraction while maintaining good grip.  This was confirmed under A/P and lateral  fluoroscopy in order to ensure its placement as an eventual trial for placement of our final graft.  We irrigated with saline.  Once confirmed placement, the Hedron implant filled with allograft was impacted into position at L4/5.   Through a combination of intradiscal distraction and anterior releasing, we were able to correct the anterior deformity during disc preparation and placement of the graft.  At this point, final radiographs were performed, and we began closure.  The wound was closed using 0 Vicryl interrupted suture in the fascia and 2-0 Vicryl inverted suture were placed in the subcutaneous tissue and dermis. 3-0 monocryl was used for final closure. Dermabond was used to close the skin.    After closing the anterior part in layers, the patient was repositioned into prone position.  All pressure points were checked and double-checked.  The posterior operative site was prepped and draped in standard fashion.    The prior incision was identified and marked.  An incision was drawn in the midline.  It was opened.  The soft tissues were divided.  The paraspinous muscles were reflected in subperiosteal fashion from L4-S1.  The laminectomy defect at L3 was identified.  The stereotactic array was placed on the S1 spinous process.  Stereotactic images were acquired using intraoperative CT scanning.  This was registered to the patient.  Using stereotaxis, screw trajectories were planned and drilled from L4-S1.  The ball-tipped probe was used to check for breaches.  No breaches were identified.  NuVasive reline screws were then placed at each level.  Once the screws were placed, rods were measured and placed.  These were secured to the screws according to manufacturer specifications.  We distracted on the right side between L4 and L5 due to a small coronal plane defect.    The stereotactic C-arm was then brought back of the field.  We confirmed placement of all implants.  At this point, we moved to  the L5-S1 decompression.  The spinous process of L5 was removed.  Using the high-speed drill, the L5 lamina including the bilateral L5 inferior articulating processes were removed.  The ligamentum flavum was identified.  Using the 2, 3, and 4 mm Kerrison punches, the ligamentum flavum was removed.  The L5 and S1 nerve roots were identified bilaterally and decompressed using the Kerrison punches.  Thus, complete facetectomy and foraminotomies were performed at L5-S1.  We irrigated the incision and obtained hemostasis.  A drain was placed.  We decorticated the posterior elements between L4 and S1 and placed a mixture of allograft and autograft for arthrodesis between L4 and S1.  We then closed the wound in layers with staples on the skin.  A sterile dressing was applied.   Needle, lap and all counts were correct at the end of the case.     Edsel Goods PA assisted in the entire procedure. An assistant was required for this procedure due to the complexity.  The assistant provided assistance in tissue manipulation and suction, and was required for the successful and safe performance of the procedure. I performed the critical portions of the procedure.   Reeves Daisy MD Neurosurgery

## 2024-06-26 NOTE — Interval H&P Note (Signed)
 History and Physical Interval Note:  06/26/2024 6:58 AM  [name]  has presented today for surgery, with the diagnosis of M54.42, M54.41, G89.29 Chronic bilateral low back pain with bilateral sciatica M47.817 Lumbosacral spondylosis without myelopathy.  The various methods of treatment have been discussed with the patient and family. After consideration of risks, benefits and other options for treatment, the patient has consented to  Procedure(s) with comments: ANTERIOR LATERAL LUMBAR FUSION 1 LEVEL (N/A) - L4-5 Lateral Lumbar Interbody Fusion POSTERIOR LUMBAR FUSION 2 LEVEL (N/A) - L4-S1 Posterior Spinal Fusion (open) APPLICATION OF INTRAOPERATIVE CT SCAN (N/A) FACETECTOMY, SPINE, 1 LEVEL (N/A) - L5-S1 bilateral facetectomies as a surgical intervention.  The patient's history has been reviewed, patient examined, no change in status, stable for surgery.  I have reviewed the patient's chart and labs.  Questions were answered to the patient's satisfaction.    Heart sounds normal no MRG. Chest Clear to Auscultation Bilaterally.   John Marks

## 2024-06-26 NOTE — TOC CM/SW Note (Signed)
 Transition of Care [facility]) CM/SW Note   Transition of Care [facility]) - Inpatient Brief Assessment   Patient Details  Name: John Marks MRN: [id number] Date of Birth: 02-22-1941  [facility]) CM/SW Contact:    Alvaro Louder, LCSW Phone Number: 06/26/2024, 4:16 PM   Clinical Narrative:  Per chart review TOC consulted for SNF Placement/ Home health. LCSWA to follow recommendations placed by PT and OT.  TOC to follow for discharge  Transition of Care Asessment: Insurance and Status: Insurance coverage has been reviewed Patient has primary care physician: Yes Home environment has been reviewed: Single familt home Prior level of function:: Ox4 Prior/Current Home Services: No current home services Social Drivers of Health Review: SDOH reviewed no interventions necessary Readmission risk has been reviewed: Yes Transition of care needs: no transition of care needs at this time

## 2024-06-26 NOTE — Plan of Care (Signed)

## 2024-06-26 NOTE — Discharge Instructions (Addendum)
 Your surgeon has performed an operation on your lumbar spine (low back) to relieve pressure on one or more nerves. Many times, patients feel better immediately after surgery and can "overdo it." Even if you feel well, it is important that you follow these activity guidelines. If you do not let your back heal properly from the surgery, you can increase the chance of hardware complications and/or return of your symptoms. The following are instructions to help in your recovery once you have been discharged from the hospital.  Do not use NSAIDs for 3 months after surgery.  *Regarding compression stockings-  Please wear day and night until you are walking a couple hundred feet three times a day.   Activity    No bending, lifting, or twisting ("BLT"). Avoid lifting objects heavier than 10 pounds (gallon milk jug).  Where possible, avoid household activities that involve lifting, bending, pushing, or pulling such as laundry, vacuuming, grocery shopping, and childcare. Try to arrange for help from friends and family for these activities while your back heals.  Increase physical activity slowly as tolerated.  Taking short walks is encouraged, but avoid strenuous exercise. Do not jog, run, bicycle, lift weights, or participate in any other exercises unless specifically allowed by your doctor. Avoid prolonged sitting, including car rides.  Talk to your doctor before resuming sexual activity.  You should not drive until cleared by your doctor.  Until released by your doctor, you should not return to work or school.  You should rest at home and let your body heal.   You may shower three days after your surgery.  After showering, lightly dab your incision dry. Do not take a tub bath or go swimming for 3 weeks, or until approved by your doctor at your follow-up appointment.  If you smoke, we strongly recommend that you quit.  Smoking has been proven to interfere with normal healing in your back and will  dramatically reduce the success rate of your surgery. Please contact [facility] (800-QUIT-NOW) and use the resources at www.[facility].com for assistance in stopping smoking.  Surgical Incision   If you have a dressing on your incision, you may remove it three days after your surgery. Keep your incision area clean and dry.  Your incision was closed with staples. These will be removed in 10-14 days at your post-op clinic visit  Diet            You may return to your usual diet. Be sure to stay hydrated.  When to Contact Us   Although your surgery and recovery will likely be uneventful, you may have some residual numbness, aches, and pains in your back and/or legs. This is normal and should improve in the next few weeks.  However, should you experience any of the following, contact us  immediately: New numbness or weakness Pain that is progressively getting worse, and is not relieved by your pain medications or rest Bleeding, redness, swelling, pain, or drainage from surgical incision Chills or flu-like symptoms Fever greater than 101.0 F (38.3 C) Problems with bowel or bladder functions Difficulty breathing or shortness of breath Warmth, tenderness, or swelling in your calf  Contact Information How to contact us :  If you have any questions/concerns before or after surgery, you can reach us  at [phone], or you can send a mychart message. We can be reached by phone or mychart 8am-4pm, Monday-Friday.  *Please note: Calls after 4pm are forwarded to a third party answering service. Mychart messages are not routinely monitored during evenings,  weekends, and holidays. Please call our office to contact the answering service for urgent concerns during non-business hours.

## 2024-06-26 NOTE — Progress Notes (Signed)
 Updated patient family via messaging service that he is going to room 146 on the first floor and doing well.  VSS, pain controlled, dressings dry, clean, and intact.

## 2024-06-26 NOTE — Anesthesia Procedure Notes (Signed)
 Procedure Name: Intubation Date/Time: 06/26/2024 7:26 AM  Performed by: Stylianos Stradling, CRNAPre-anesthesia Checklist: Patient identified, Emergency Drugs available, Suction available and Patient being monitored Patient Re-evaluated:Patient Re-evaluated prior to induction Oxygen Delivery Method: Circle System Utilized Preoxygenation: Pre-oxygenation with 100% oxygen Induction Type: IV induction Ventilation: Mask ventilation without difficulty Laryngoscope Size: McGrath and 4 Grade View: Grade I Tube type: Oral Tube size: 7.5 mm Number of attempts: 1 Airway Equipment and Method: Stylet and Oral airway Placement Confirmation: ETT inserted through vocal cords under direct vision, positive ETCO2 and breath sounds checked- equal and bilateral Secured at: 22 cm Tube secured with: Tape Dental Injury: Teeth and Oropharynx as per pre-operative assessment  Comments: Easy, atraumatic intubation. Teeth, tongue and lips unchanged. Head and neck midline.

## 2024-06-26 NOTE — Transfer of Care (Signed)
 Immediate Anesthesia Transfer of Care Note  Patient: John Marks  Procedure(s) Performed: ANTERIOR LATERAL LUMBAR FUSION 1 LEVEL (Spine Lumbar) POSTERIOR LUMBAR FUSION 2 LEVEL (Spine Lumbar) APPLICATION OF INTRAOPERATIVE CT SCAN (Spine Lumbar) FACETECTOMY, SPINE, 1 LEVEL (Spine Lumbar)  Patient Location: PACU  Anesthesia Type:General  Level of Consciousness: drowsy  Airway & Oxygen Therapy: Patient Spontanous Breathing and Patient connected to face mask oxygen  Post-op Assessment: Report given to RN and Post -op Vital signs reviewed and stable  Post vital signs: Reviewed and stable  Last Vitals:  Vitals Value Taken Time  BP 116/59 06/26/24 11:48  Temp 95.9   Pulse 55 06/26/24 11:51  Resp 13 06/26/24 11:51  SpO2 100 % 06/26/24 11:51  Vitals shown include unfiled device data.  Last Pain:  Vitals:   06/26/24 0624  TempSrc: Temporal  PainSc: 5          Complications: No notable events documented.

## 2024-06-27 ENCOUNTER — Encounter: Payer: Self-pay | Admitting: Neurosurgery

## 2024-06-27 NOTE — Evaluation (Signed)
 Occupational Therapy Evaluation Patient Details Name: John Marks MRN: [id number] DOB: Feb 28, 1941 Today's Date: 06/27/2024   History of Present Illness   83 y.o with a history of HLD, HTN, Afib, LBBB, CAD, GERD, prostate CA, ILD, low back pain with bilateral sciatica now s/p lumbar fusion on 06/26/24.     Clinical Impressions Pt seen for OT evaluation this date, POD#1 from lumbar fusion. Prior to hospital admission, pt was independent with mobility, ADL, and IADL. However, has been having increased difficulty with all aspects of mobility and ADL due to low back pain and RLE pain. Pt lives with spouse who is able to provide 24/7 assist/support as needed for pt. Currently pt requires CGA for ADL transfers and mobility with RW, and PRN MIN A for LB ADL tasks.  Pt/spouse educated in back precautions, self care skills, AE/DME for bathing, dressing, and toileting needs, and home/routines modifications and falls prevention strategies to maximize safety and functional independence while minimizing falls risk and maintaining precautions. Pt/spouse verbalized understanding of all education/training provided. Handout provided to support recall and carry over of learned precautions/techniques for bed mobility, functional transfers, and self care skills. Pt will benefit from additional OT session while hospitalized prior to discharge.     If plan is discharge home, recommend the following:   A little help with walking and/or transfers;A little help with bathing/dressing/bathroom;Assistance with cooking/housework;Assist for transportation     Functional Status Assessment   Patient has had a recent decline in their functional status and demonstrates the ability to make significant improvements in function in a reasonable and predictable amount of time.     Equipment Recommendations   Tub/shower seat     Recommendations for Other Services         Precautions/Restrictions    Precautions Precautions: Fall Recall of Precautions/Restrictions: Intact Required Braces or Orthoses: Spinal Brace Spinal Brace: Lumbar corset;Applied in sitting position Restrictions Weight Bearing Restrictions Per Provider Order: No Other Position/Activity Restrictions: LSO pending, not delivered yet     Mobility Bed Mobility               General bed mobility comments: NT, in recliner pre and post session    Transfers Overall transfer level: Needs assistance Equipment used: Rolling walker (2 wheels) Transfers: Sit to/from Stand Sit to Stand: Contact guard assist                  Balance Overall balance assessment: Needs assistance Sitting-balance support: Feet supported Sitting balance-Leahy Scale: Good     Standing balance support: During functional activity, Reliant on assistive device for balance Standing balance-Leahy Scale: Fair                             ADL either performed or assessed with clinical judgement   ADL Overall ADL's : Needs assistance/impaired                                       General ADL Comments: Pt currently requires PRN MIN A for LB ADL tasks in order to maintain precautions. CGA for ADL transfers with RW     Vision         Perception         Praxis         Pertinent Vitals/Pain Pain Assessment Pain Assessment: 0-10 Pain Score: 4  Pain Location:  low back, R lateral thigh Pain Descriptors / Indicators: Aching Pain Intervention(s): Monitored during session, Premedicated before session, Repositioned     Extremity/Trunk Assessment Upper Extremity Assessment Upper Extremity Assessment: Overall WFL for tasks assessed   Lower Extremity Assessment Lower Extremity Assessment: Generalized weakness;Defer to PT evaluation   Cervical / Trunk Assessment Cervical / Trunk Assessment: Back Surgery   Communication Communication Communication: No apparent difficulties   Cognition Arousal:  Alert Behavior During Therapy: WFL for tasks assessed/performed Cognition: No apparent impairments                               Following commands: Intact       Cueing  General Comments   Cueing Techniques: Verbal cues      Exercises Other Exercises Other Exercises: Pt/spouse educated in back precautions and how to maintain during ADL and mobility, falls prevention, home/routines modifications, AE/DME, and back brace mgt (not arrived yet).   Shoulder Instructions      Home Living Family/patient expects to be discharged to:: Private residence Living Arrangements: Spouse/significant other Available Help at Discharge: Family;Available 24 hours/day Type of Home: House Home Access: Stairs to enter [employer] of Steps: 3 Entrance Stairs-Rails: Right;Left Home Layout: Two level;1/2 bath on main level;Bed/bath upstairs Alternate Level Stairs-Number of Steps: 15 Alternate Level Stairs-Rails: Left Bathroom Shower/Tub: Producer, Television/film/video: Standard Bathroom Accessibility: Yes   Home Equipment: Crutches;Cane - single point;Hand held shower head;Adaptive equipment Adaptive Equipment: Reacher;Sock aid;Long-handled sponge        Prior Functioning/Environment Prior Level of Function : Independent/Modified Independent;Driving             Mobility Comments: independent ADLs Comments: independent ADLs/iADLs    OT Problem List: Decreased strength;Pain;Impaired balance (sitting and/or standing);Decreased knowledge of use of DME or AE;Decreased knowledge of precautions   OT Treatment/Interventions: Self-care/ADL training;Therapeutic exercise;Therapeutic activities;DME and/or AE instruction;Patient/family education;Balance training      OT Goals(Current goals can be found in the care plan section)   Acute Rehab OT Goals Patient Stated Goal: get back to golfing adn travel OT Goal Formulation: With patient/family Time For Goal  Achievement: 07/11/24 Potential to Achieve Goals: Good ADL Goals Additional ADL Goal #1: Pt will complete all aspects of dressing with mod indep, AE PRN, maintaining back precautions, 1/1 opportunity. Additional ADL Goal #2: Pt will demo indep with LSO management, 2/2 opportunities.   OT Frequency:  Min 2X/week    Co-evaluation              AM-PAC OT 6 Clicks Daily Activity     Outcome Measure Help from another person eating meals?: None Help from another person taking care of personal grooming?: None Help from another person toileting, which includes using toliet, bedpan, or urinal?: None Help from another person bathing (including washing, rinsing, drying)?: A Little Help from another person to put on and taking off regular upper body clothing?: None Help from another person to put on and taking off regular lower body clothing?: A Little 6 Click Score: 22   End of Session Equipment Utilized During Treatment: Rolling walker (2 wheels)  Activity Tolerance: Patient tolerated treatment well Patient left: in chair;with call bell/phone within reach;with chair alarm set;with family/visitor present  OT Visit Diagnosis: Other abnormalities of gait and mobility (R26.89);Pain Pain - Right/Left: Right Pain - part of body: Leg                Time:  8982-8954 OT Time Calculation (min): 28 min Charges:  OT General Charges $OT Visit: 1 Visit OT Evaluation $OT Eval Low Complexity: 1 Low OT Treatments $Self Care/Home Management : 8-22 mins  Warren SAUNDERS., MPH, MS, OTR/L ascom [phone] 06/27/24, 1:35 PM

## 2024-06-27 NOTE — Evaluation (Signed)
 Physical Therapy Evaluation Patient Details Name: John Marks MRN: [id number] DOB: 01-15-1941 Today's Date: 06/27/2024  History of Present Illness  John Marks is s/p L4-5 XLIF, L4-S1 PSF. L5-S1 decompression  Clinical Impression  Patient noted to be in seated position at PT arrival in room, for an initial PT evaluation due to a decline in functional status, with baseline mobility reported as independent, and currently requiring min/CGA for transfers, ambulation and stair navigation with RW. The patient is A&O x 4, presenting with good willingness to work with PT and goals of going home, with discharge expectations that include HHPT. The patient resides in a house and lives with spouse with family/friend support. There are 3 STE inside the residence. Gait was assessed with RW. The overall clinical impression is that the patient presents with mild mobility limitations secondary to lumbar fusion. Recommended skilled PT will address safety, mobility, and discharge planning. PT recommendation to d/c patient to HHPT upon medical clearance.        If plan is discharge home, recommend the following: A little help with walking and/or transfers;A little help with bathing/dressing/bathroom;Assist for transportation;Help with stairs or ramp for entrance   Can travel by private vehicle        Equipment Recommendations None recommended by PT  Recommendations for Other Services       Functional Status Assessment Patient has had a recent decline in their functional status and demonstrates the ability to make significant improvements in function in a reasonable and predictable amount of time.     Precautions / Restrictions Precautions Precautions: Fall Required Braces or Orthoses: Spinal Brace Spinal Brace: Thoracolumbosacral orthotic Restrictions Weight Bearing Restrictions Per Provider Order: No      Mobility  Bed Mobility Overal bed mobility: Needs Assistance Bed Mobility: Rolling,  Sidelying to Sit Rolling: Supervision, Contact guard assist Sidelying to sit: Supervision, Contact guard assist       General bed mobility comments: verbal for rolling    Transfers Overall transfer level: Needs assistance Equipment used: Rolling walker (2 wheels) Transfers: Sit to/from Stand Sit to Stand: Min assist, Contact guard assist           General transfer comment: vc for RW manageement    Ambulation/Gait Ambulation/Gait assistance: Min assist Gait Distance (Feet): 50 Feet Assistive device: Rolling walker (2 wheels) Gait Pattern/deviations: Step-through pattern, Trunk flexed, Drifts right/left, Decreased stride length Gait velocity: decreased     General Gait Details: vc for movement sequence  Stairs Stairs: Yes Stairs assistance: Min assist, Contact guard assist Stair Management: One rail Left, Step to pattern Number of Stairs: 8    Wheelchair Mobility     Tilt Bed    Modified Rankin (Stroke Patients Only)       Balance Overall balance assessment: Needs assistance Sitting-balance support: Feet supported Sitting balance-Leahy Scale: Good     Standing balance support: During functional activity, Reliant on assistive device for balance Standing balance-Leahy Scale: Fair                               Pertinent Vitals/Pain Pain Assessment Pain Assessment: No/denies pain    Home Living Family/patient expects to be discharged to:: Private residence Living Arrangements: Spouse/significant other Available Help at Discharge: Family;Available 24 hours/day Type of Home: House Home Access: Stairs to enter Entrance Stairs-Rails: Right;Left Entrance Stairs-Number of Steps: 3 Alternate Level Stairs-Number of Steps: 15 Home Layout: Two level;1/2 bath on main level;Bed/bath upstairs  Home Equipment: Crutches;Cane - single point      Prior Function Prior Level of Function : Independent/Modified Independent             Mobility  Comments: independent ADLs Comments: independent ADLs/iADLs     Extremity/Trunk Assessment   Upper Extremity Assessment Upper Extremity Assessment: Generalized weakness;Overall WFL for tasks assessed    Lower Extremity Assessment Lower Extremity Assessment: Generalized weakness    Cervical / Trunk Assessment Cervical / Trunk Assessment: Normal  Communication   Communication Communication: No apparent difficulties    Cognition Arousal: Alert Behavior During Therapy: WFL for tasks assessed/performed   PT - Cognitive impairments: No apparent impairments                         Following commands: Intact       Cueing       General Comments      Exercises Other Exercises Other Exercises: Pt educated on rolling, movement precautions, pt ed. on role of PT   Assessment/Plan    PT Assessment Patient needs continued PT services  PT Problem List Decreased strength;Decreased activity tolerance;Decreased balance;Decreased mobility       PT Treatment Interventions Gait training;Stair training;Therapeutic activities;Functional mobility training;Therapeutic exercise;Patient/family education;Neuromuscular re-education;Balance training    PT Goals (Current goals can be found in the Care Plan section)  Acute Rehab PT Goals Patient Stated Goal: Pt wants to go home PT Goal Formulation: With patient Time For Goal Achievement: 07/18/24    Frequency Min 2X/week     Co-evaluation               AM-PAC PT 6 Clicks Mobility  Outcome Measure Help needed turning from your back to your side while in a flat bed without using bedrails?: A Little Help needed moving from lying on your back to sitting on the side of a flat bed without using bedrails?: A Little Help needed moving to and from a bed to a chair (including a wheelchair)?: A Little Help needed standing up from a chair using your arms (e.g., wheelchair or bedside chair)?: A Little Help needed to walk in  hospital room?: A Little Help needed climbing 3-5 steps with a railing? : A Little 6 Click Score: 18    End of Session Equipment Utilized During Treatment: Gait belt Activity Tolerance: Patient tolerated treatment well Patient left: in chair;with call bell/phone within reach;with chair alarm set Nurse Communication: Mobility status PT Visit Diagnosis: Other abnormalities of gait and mobility (R26.89);Muscle weakness (generalized) (M62.81);Difficulty in walking, not elsewhere classified (R26.2)    Time: 9042-8980 PT Time Calculation (min) (ACUTE ONLY): 22 min   Charges:   PT Evaluation $PT Eval Low Complexity: 1 Low   PT General Charges $$ ACUTE PT VISIT: 1 Visit         Sherlean Lesches DPT, PT    Jusiah Aguayo A Wendle Kina 06/27/2024, 11:18 AM

## 2024-06-27 NOTE — Progress Notes (Signed)
   Neurosurgery Progress Note  History: John Marks is s/p L4-5 XLIF, L4-S1 PSF. L5-S1 decompression  POD1: Pt doing well this morning with some right thigh pain with movement. Has been up to the bathroom several times and feels at present his pain is well controlled  Physical Exam: Vitals:   06/26/24 2011 06/27/24 0258  BP: 138/79 135/71  Pulse: 84 65  Resp: 18 18  Temp: 98.1 F (36.7 C) 97.6 F (36.4 C)  SpO2: 97% 98%    AA Ox3 CNI  Strength:5/5 throughout BLE Incision: with blood tinged dressing in place HV output since surgery   Data:  Other tests/results: NA  Assessment/Plan:  John Marks is a 83 y.o presenting with lumbosacral spondylosis and sciatica s/p L4-5 XLIF, L4-S1 PSF. L5-S1 decompression  - mobilize - continue to monitor drain - pain control - DVT prophylaxis - PTOT; brace ordered. To be worn when OOB and ambulating and comfort only. Ok to work with therapy prior to brace delivery.   Edsel Goods PA-C Department of Neurosurgery

## 2024-06-28 ENCOUNTER — Encounter: Payer: Self-pay | Admitting: Neurosurgery

## 2024-06-28 ENCOUNTER — Other Ambulatory Visit: Payer: Self-pay

## 2024-06-28 MED ORDER — OXYCODONE HCL 5 MG PO TABS
5.0000 mg | ORAL_TABLET | ORAL | 0 refills | Status: DC | PRN
  Filled 2024-06-28: qty 30, 5d supply, fill #0

## 2024-06-28 MED ORDER — SENNA 8.6 MG PO TABS
1.0000 | ORAL_TABLET | Freq: Two times a day (BID) | ORAL | 0 refills | Status: DC | PRN
  Filled 2024-06-28: qty 30, 15d supply, fill #0

## 2024-06-28 MED ORDER — POLYETHYLENE GLYCOL 3350 17 GM/SCOOP PO POWD
17.0000 g | Freq: Every day | ORAL | 0 refills | Status: AC | PRN
  Filled 2024-06-28: qty 238, 14d supply, fill #0

## 2024-06-28 MED ORDER — METHOCARBAMOL 500 MG PO TABS
500.0000 mg | ORAL_TABLET | Freq: Four times a day (QID) | ORAL | 0 refills | Status: AC | PRN
  Filled 2024-06-28: qty 120, 30d supply, fill #0

## 2024-06-28 NOTE — TOC Transition Note (Signed)
 Transition of Care [facility]) - Discharge Note   Patient Details  Name: John Marks MRN: [id number] Date of Birth: [birth year]  Transition of [facility]) CM/SW Contact:  Alvaro Louder, LCSW Phone Number: 06/28/2024, 2:12 PM   Clinical Narrative:   LCSWA confirmed with MD that patient is stable for discharge. LCSWA notified the patient and they are in agreement with discharge. LCSWA discussed PT recommendation of HH Patient was agreeable. LCSWA reached out to [facility] admissions coordinator an started service for patient. The patient reported that he would have family come pick him up at discharge.  TOC signing off  Final next level of care: Home w Home Health Services Barriers to Discharge: No Barriers Identified   Patient Goals and CMS Choice            Discharge Placement              Patient chooses bed at:  (Home) Patient to be transferred to facility by: Family Name of family member notified: Self Patient and family notified of of transfer: 06/28/24  Discharge Plan and Services Additional resources added to the After Visit Summary for                            [facility] Arranged: PT, OT HH Agency: [facility] Date HH Agency Contacted: 06/28/24   Representative spoke with at [facility] Agency: Leita  Social Drivers of Health (SDOH) Interventions SDOH Screenings   Food Insecurity: No Food Insecurity (02/23/2024)   Received from [facility]  Housing: Low Risk  (04/11/2024)   Received from [facility]  Transportation Needs: No Transportation Needs (02/23/2024)   Received from [facility]  Utilities: Not At Risk (02/23/2024)   Received from [facility]  Financial Resource Strain: Low Risk  (02/23/2024)   Received from [facility]  Physical Activity: Insufficiently Active (06/06/2023)   Received from [facility]  Social Connections: Moderately  Integrated (06/26/2024)  Stress: No Stress Concern Present (06/06/2023)   Received from [facility]  Tobacco Use: Medium Risk (06/26/2024)  Health Literacy: Adequate Health Literacy (06/06/2023)   Received from [facility]     Readmission Risk Interventions     No data to display

## 2024-06-28 NOTE — Plan of Care (Signed)
 Problem: Education: Goal: Knowledge of General Education information will improve Description: Including pain rating scale, medication(s)/side effects and non-pharmacologic comfort measures 06/28/2024 1451 by Dario Asberry DEL, RN Outcome: Adequate for Discharge 06/28/2024 1450 by Dario Asberry DEL, RN Outcome: Adequate for Discharge   Problem: Health Behavior/Discharge Planning: Goal: Ability to manage health-related needs will improve 06/28/2024 1451 by Dario Asberry DEL, RN Outcome: Adequate for Discharge 06/28/2024 1450 by Dario Asberry DEL, RN Outcome: Adequate for Discharge   Problem: Clinical Measurements: Goal: Ability to maintain clinical measurements within normal limits will improve 06/28/2024 1451 by Dario Asberry DEL, RN Outcome: Adequate for Discharge 06/28/2024 1450 by Dario Asberry DEL, RN Outcome: Adequate for Discharge Goal: Will remain free from infection 06/28/2024 1451 by Dario Asberry DEL, RN Outcome: Adequate for Discharge 06/28/2024 1450 by Dario Asberry DEL, RN Outcome: Adequate for Discharge Goal: Diagnostic test results will improve 06/28/2024 1451 by Dario Asberry DEL, RN Outcome: Adequate for Discharge 06/28/2024 1450 by Dario Asberry DEL, RN Outcome: Adequate for Discharge Goal: Respiratory complications will improve 06/28/2024 1451 by Dario Asberry DEL, RN Outcome: Adequate for Discharge 06/28/2024 1450 by Dario Asberry DEL, RN Outcome: Adequate for Discharge Goal: Cardiovascular complication will be avoided 06/28/2024 1451 by Dario Asberry DEL, RN Outcome: Adequate for Discharge 06/28/2024 1450 by Dario Asberry DEL, RN Outcome: Adequate for Discharge   Problem: Activity: Goal: Risk for activity intolerance will decrease 06/28/2024 1451 by Dario Asberry DEL, RN Outcome: Adequate for Discharge 06/28/2024 1450 by Dario Asberry DEL, RN Outcome: Adequate for Discharge   Problem:  Nutrition: Goal: Adequate nutrition will be maintained 06/28/2024 1451 by Dario Asberry DEL, RN Outcome: Adequate for Discharge 06/28/2024 1450 by Dario Asberry DEL, RN Outcome: Adequate for Discharge   Problem: Coping: Goal: Level of anxiety will decrease 06/28/2024 1451 by Dario Asberry DEL, RN Outcome: Adequate for Discharge 06/28/2024 1450 by Dario Asberry DEL, RN Outcome: Adequate for Discharge   Problem: Elimination: Goal: Will not experience complications related to bowel motility 06/28/2024 1451 by Dario Asberry DEL, RN Outcome: Adequate for Discharge 06/28/2024 1450 by Dario Asberry DEL, RN Outcome: Adequate for Discharge Goal: Will not experience complications related to urinary retention 06/28/2024 1451 by Dario Asberry DEL, RN Outcome: Adequate for Discharge 06/28/2024 1450 by Dario Asberry DEL, RN Outcome: Adequate for Discharge   Problem: Pain Managment: Goal: General experience of comfort will improve and/or be controlled 06/28/2024 1451 by Dario Asberry DEL, RN Outcome: Adequate for Discharge 06/28/2024 1450 by Dario Asberry DEL, RN Outcome: Adequate for Discharge   Problem: Safety: Goal: Ability to remain free from injury will improve 06/28/2024 1451 by Dario Asberry DEL, RN Outcome: Adequate for Discharge 06/28/2024 1450 by Dario Asberry DEL, RN Outcome: Adequate for Discharge   Problem: Skin Integrity: Goal: Risk for impaired skin integrity will decrease 06/28/2024 1451 by Dario Asberry DEL, RN Outcome: Adequate for Discharge 06/28/2024 1450 by Dario Asberry DEL, RN Outcome: Adequate for Discharge   Problem: Education: Goal: Ability to verbalize activity precautions or restrictions will improve 06/28/2024 1451 by Dario Asberry DEL, RN Outcome: Adequate for Discharge 06/28/2024 1450 by Dario Asberry DEL, RN Outcome: Adequate for Discharge Goal: Knowledge of the prescribed therapeutic regimen will  improve 06/28/2024 1451 by Dario Asberry DEL, RN Outcome: Adequate for Discharge 06/28/2024 1450 by Dario Asberry DEL, RN Outcome: Adequate for Discharge Goal: Understanding of discharge needs will improve 06/28/2024 1451 by Dario Asberry DEL, RN Outcome: Adequate for Discharge 06/28/2024 1450 by Dario Asberry DEL, RN Outcome: Adequate for Discharge   Problem: Activity:  Goal: Ability to avoid complications of mobility impairment will improve 06/28/2024 1451 by Dario Asberry DEL, RN Outcome: Adequate for Discharge 06/28/2024 1450 by Dario Asberry DEL, RN Outcome: Adequate for Discharge Goal: Ability to tolerate increased activity will improve 06/28/2024 1451 by Dario Asberry DEL, RN Outcome: Adequate for Discharge 06/28/2024 1450 by Dario Asberry DEL, RN Outcome: Adequate for Discharge Goal: Will remain free from falls 06/28/2024 1451 by Dario Asberry DEL, RN Outcome: Adequate for Discharge 06/28/2024 1450 by Dario Asberry DEL, RN Outcome: Adequate for Discharge   Problem: Bowel/Gastric: Goal: Gastrointestinal status for postoperative course will improve 06/28/2024 1451 by Dario Asberry DEL, RN Outcome: Adequate for Discharge 06/28/2024 1450 by Dario Asberry DEL, RN Outcome: Adequate for Discharge   Problem: Clinical Measurements: Goal: Ability to maintain clinical measurements within normal limits will improve 06/28/2024 1451 by Dario Asberry DEL, RN Outcome: Adequate for Discharge 06/28/2024 1450 by Dario Asberry DEL, RN Outcome: Adequate for Discharge Goal: Postoperative complications will be avoided or minimized 06/28/2024 1451 by Dario Asberry DEL, RN Outcome: Adequate for Discharge 06/28/2024 1450 by Dario Asberry DEL, RN Outcome: Adequate for Discharge Goal: Diagnostic test results will improve 06/28/2024 1451 by Dario Asberry DEL, RN Outcome: Adequate for Discharge 06/28/2024 1450 by Dario Asberry DEL,  RN Outcome: Adequate for Discharge   Problem: Pain Management: Goal: Pain level will decrease 06/28/2024 1451 by Dario Asberry DEL, RN Outcome: Adequate for Discharge 06/28/2024 1450 by Dario Asberry DEL, RN Outcome: Adequate for Discharge   Problem: Skin Integrity: Goal: Will show signs of wound healing 06/28/2024 1451 by Dario Asberry DEL, RN Outcome: Adequate for Discharge 06/28/2024 1450 by Dario Asberry DEL, RN Outcome: Adequate for Discharge   Problem: Health Behavior/Discharge Planning: Goal: Identification of resources available to assist in meeting health care needs will improve 06/28/2024 1451 by Dario Asberry DEL, RN Outcome: Adequate for Discharge 06/28/2024 1450 by Dario Asberry DEL, RN Outcome: Adequate for Discharge   Problem: Bladder/Genitourinary: Goal: Urinary functional status for postoperative course will improve 06/28/2024 1451 by Dario Asberry DEL, RN Outcome: Adequate for Discharge 06/28/2024 1450 by Dario Asberry DEL, RN Outcome: Adequate for Discharge

## 2024-06-28 NOTE — Care Management Important Message (Signed)
 Important Message  Patient Details  Name: John Marks MRN: [id number] Date of Birth: 1941-01-31   Important Message Given:  Yes - Medicare IM     Selmer Adduci W, CMA 06/28/2024, 1:02 PM

## 2024-06-28 NOTE — Discharge Summary (Signed)
 Discharge Summary  Patient ID: John Marks MRN: [id number] DOB/AGE: 83-Nov-1942 83 y.o.  Admit date: 06/26/2024 Discharge date: 06/28/2024  Admission Diagnoses: M54.42, M54.41, G89.29 Chronic bilateral low back pain with bilateral sciatica, M47.817 Lumbosacral spondylosis without myelopathy  Discharge Diagnoses:  Principal Problem:   S/P lumbar fusion Active Problems:   Chronic bilateral low back pain with bilateral sciatica   Lumbosacral spondylosis without myelopathy   Discharged Condition: good  Hospital Course:  John Marks is a pleasant 83 y.o presenting with lumbosacral spondylosis s/p L4-5 XLIF and PSF with L5-S1 decompression. His intraoperative course was uncomplicated. He was admitted for pain control, therapy evaluation, and drain output monitoring. His pain was well controlled on minimal medications. He was seen and evaluated by therapy and deemed appropriate for discharge home with [facility] on POD1 however his drain ouput was too high. He was kept inpatient another night and his drain output dropped significantly on POD2 and was removed in the afternoon. He was discharged with prescriptions for Oxycodone , Robaxin , Senna, and Miralax  to take as needed.   Consults: None  Significant Diagnostic Studies: NA  Treatments: surgery: as above. Please see separately dictated operative report for further details   Discharge Exam: Blood pressure (!) 148/74, pulse 70, temperature 97.8 F (36.6 C), resp. rate 16, height 6' (1.829 m), weight 93.8 kg, SpO2 98%. AA Ox3 CNI   Strength:5/5 throughout BLE Incision: with blood tinged dressing in place  Disposition: Discharge disposition: 06-Home-Health Care Svc       Discharge Instructions     Incentive spirometry RT   Complete by: As directed    Remove dressing in 24 hours   Complete by: As directed       Allergies as of 06/28/2024       Reactions   Latex Itching        Medication List     PAUSE taking these  medications    apixaban  5 MG Tabs tablet Wait to take this until your doctor or other care provider tells you to start again. Commonly known as: ELIQUIS  Take 5 mg by mouth 2 (two) times daily.       TAKE these medications    acetaminophen  500 MG tablet Commonly known as: TYLENOL  Take 500 mg by mouth every 6 (six) hours as needed for moderate pain (pain score 4-6).   amLODipine  5 MG tablet Commonly known as: NORVASC  Take 5 mg by mouth 2 (two) times daily.   atorvastatin  40 MG tablet Commonly known as: LIPITOR Take 40 mg by mouth daily.   Choline Fenofibrate  135 MG capsule Take 135 mg by mouth daily.   cyanocobalamin 100 MCG tablet Commonly known as: VITAMIN B12 Take 100 mcg by mouth daily.   flecainide  50 MG tablet Commonly known as: TAMBOCOR  Take 50 mg by mouth 2 (two) times daily.   Magnesium  250 MG Tabs Take 250 mg by mouth daily.   methocarbamol  500 MG tablet Commonly known as: ROBAXIN  Take 1 tablet (500 mg total) by mouth every 6 (six) hours as needed for muscle spasms.   metoprolol  tartrate 25 MG tablet Commonly known as: LOPRESSOR  Take 0.5 tablets (12.5 mg total) by mouth 2 (two) times daily.   oxyCODONE  5 MG immediate release tablet Commonly known as: Oxy IR/ROXICODONE  Take 1 tablet (5 mg total) by mouth every 4 (four) hours as needed for moderate pain (pain score 4-6).   polyethylene glycol 17 g packet Commonly known as: MIRALAX  / GLYCOLAX  Take 17 g by mouth daily as  needed for moderate constipation.   senna 8.6 MG Tabs tablet Commonly known as: SENOKOT Take 1 tablet (8.6 mg total) by mouth 2 (two) times daily as needed for mild constipation.   Ventolin  HFA 108 (90 Base) MCG/ACT inhaler Generic drug: albuterol  Inhale 2 puffs into the lungs every 6 (six) hours as needed for wheezing or shortness of breath.   Vitamin D-3 25 MCG (1000 UT) Caps Take 1,000 Units by mouth daily.        Contact information for after-discharge care     Home  Medical Care     [facility] [place] .   Service: Home Health Services Contact information: [phone] [place]  [phone]                     Signed: Edsel Jama Goods 06/28/2024, 1:00 PM

## 2024-06-28 NOTE — Plan of Care (Signed)
  Problem: Education: Goal: Knowledge of General Education information will improve Description: Including pain rating scale, medication(s)/side effects and non-pharmacologic comfort measures Outcome: Adequate for Discharge   Problem: Health Behavior/Discharge Planning: Goal: Ability to manage health-related needs will improve Outcome: Adequate for Discharge   Problem: Clinical Measurements: Goal: Ability to maintain clinical measurements within normal limits will improve Outcome: Adequate for Discharge Goal: Will remain free from infection Outcome: Adequate for Discharge Goal: Diagnostic test results will improve Outcome: Adequate for Discharge Goal: Respiratory complications will improve Outcome: Adequate for Discharge Goal: Cardiovascular complication will be avoided Outcome: Adequate for Discharge   Problem: Activity: Goal: Risk for activity intolerance will decrease Outcome: Adequate for Discharge   Problem: Nutrition: Goal: Adequate nutrition will be maintained Outcome: Adequate for Discharge   Problem: Coping: Goal: Level of anxiety will decrease Outcome: Adequate for Discharge   Problem: Elimination: Goal: Will not experience complications related to bowel motility Outcome: Adequate for Discharge Goal: Will not experience complications related to urinary retention Outcome: Adequate for Discharge   Problem: Pain Managment: Goal: General experience of comfort will improve and/or be controlled Outcome: Adequate for Discharge   Problem: Safety: Goal: Ability to remain free from injury will improve Outcome: Adequate for Discharge   Problem: Skin Integrity: Goal: Risk for impaired skin integrity will decrease Outcome: Adequate for Discharge   Problem: Education: Goal: Ability to verbalize activity precautions or restrictions will improve Outcome: Adequate for Discharge Goal: Knowledge of the prescribed therapeutic regimen will improve Outcome: Adequate for  Discharge Goal: Understanding of discharge needs will improve Outcome: Adequate for Discharge   Problem: Activity: Goal: Ability to avoid complications of mobility impairment will improve Outcome: Adequate for Discharge Goal: Ability to tolerate increased activity will improve Outcome: Adequate for Discharge Goal: Will remain free from falls Outcome: Adequate for Discharge   Problem: Bowel/Gastric: Goal: Gastrointestinal status for postoperative course will improve Outcome: Adequate for Discharge   Problem: Clinical Measurements: Goal: Ability to maintain clinical measurements within normal limits will improve Outcome: Adequate for Discharge Goal: Postoperative complications will be avoided or minimized Outcome: Adequate for Discharge Goal: Diagnostic test results will improve Outcome: Adequate for Discharge   Problem: Pain Management: Goal: Pain level will decrease Outcome: Adequate for Discharge   Problem: Skin Integrity: Goal: Will show signs of wound healing Outcome: Adequate for Discharge   Problem: Health Behavior/Discharge Planning: Goal: Identification of resources available to assist in meeting health care needs will improve Outcome: Adequate for Discharge   Problem: Bladder/Genitourinary: Goal: Urinary functional status for postoperative course will improve Outcome: Adequate for Discharge

## 2024-06-28 NOTE — Progress Notes (Signed)
 Physical Therapy Treatment Patient Details Name: John Marks MRN: [id number] DOB: [birth year] Today's Date: 06/28/2024   History of Present Illness 83 y.o with a history of HLD, HTN, Afib, LBBB, CAD, GERD, prostate CA, ILD, low back pain with bilateral sciatica now s/p lumbar fusion on 06/26/24.    PT Comments  Patient seen for PT session focused on functional mobility. Patient required CGA/supervision for 150' feet RW. Tolerated session well with no signs of exertion or distress. Vitals remained stable during activity. Patient shows great potential to make progress with continued acute level rehab. Continued skilled PT recommended to progress toward functional goals and support discharge readiness. Pt making good progress toward goals, will continue to follow POC. Discharge recommendation remains appropriate     If plan is discharge home, recommend the following: A little help with walking and/or transfers;A little help with bathing/dressing/bathroom;Assist for transportation;Help with stairs or ramp for entrance   Can travel by private vehicle        Equipment Recommendations  None recommended by PT    Recommendations for Other Services       Precautions / Restrictions Precautions Precautions: Fall Recall of Precautions/Restrictions: Intact Required Braces or Orthoses: Spinal Brace Spinal Brace: Lumbar corset;Applied in sitting position Restrictions Weight Bearing Restrictions Per Provider Order: No Other Position/Activity Restrictions: LSO pending, not delivered yet     Mobility  Bed Mobility Overal bed mobility: Needs Assistance Bed Mobility: Rolling, Sidelying to Sit Rolling: Supervision, Contact guard assist Sidelying to sit: Supervision, Contact guard assist       General bed mobility comments: NT, in recliner pre and post session    Transfers Overall transfer level: Needs assistance Equipment used: Rolling walker (2 wheels) Transfers: Sit to/from Stand Sit  to Stand: Contact guard assist           General transfer comment: vc for RW manageement    Ambulation/Gait Ambulation/Gait assistance: Contact guard assist, Supervision Gait Distance (Feet): 150 Feet Assistive device: Rolling walker (2 wheels) Gait Pattern/deviations: Step-through pattern, Trunk flexed, Drifts right/left, Decreased stride length Gait velocity: decreased     General Gait Details: vc for movement sequence   Stairs             Wheelchair Mobility     Tilt Bed    Modified Rankin (Stroke Patients Only)       Balance Overall balance assessment: Needs assistance Sitting-balance support: Feet supported Sitting balance-Leahy Scale: Normal     Standing balance support: During functional activity, Reliant on assistive device for balance Standing balance-Leahy Scale: Good                              Communication Communication Communication: No apparent difficulties  Cognition Arousal: Alert Behavior During Therapy: WFL for tasks assessed/performed   PT - Cognitive impairments: No apparent impairments                         Following commands: Intact      Cueing Cueing Techniques: Verbal cues  Exercises      General Comments        Pertinent Vitals/Pain Pain Assessment Pain Assessment: Faces Faces Pain Scale: Hurts a little bit Pain Location: low back, R lateral thigh Pain Descriptors / Indicators: Aching Pain Intervention(s): Monitored during session    Home Living  Prior Function            PT Goals (current goals can now be found in the care plan section) Acute Rehab PT Goals Patient Stated Goal: Pt wants to go home PT Goal Formulation: With patient Time For Goal Achievement: 07/18/24 Progress towards PT goals: Progressing toward goals    Frequency    Min 2X/week      PT Plan      Co-evaluation              AM-PAC PT 6 Clicks Mobility    Outcome Measure  Help needed turning from your back to your side while in a flat bed without using bedrails?: A Little Help needed moving from lying on your back to sitting on the side of a flat bed without using bedrails?: A Little Help needed moving to and from a bed to a chair (including a wheelchair)?: A Little Help needed standing up from a chair using your arms (e.g., wheelchair or bedside chair)?: A Little Help needed to walk in hospital room?: A Little Help needed climbing 3-5 steps with a railing? : A Little 6 Click Score: 18    End of Session Equipment Utilized During Treatment: Gait belt Activity Tolerance: Patient tolerated treatment well Patient left: in chair;with call bell/phone within reach;with chair alarm set Nurse Communication: Mobility status PT Visit Diagnosis: Other abnormalities of gait and mobility (R26.89);Muscle weakness (generalized) (M62.81);Difficulty in walking, not elsewhere classified (R26.2)     Time: 8657-8643 PT Time Calculation (min) (ACUTE ONLY): 14 min  Charges:    $Therapeutic Activity: 8-22 mins PT General Charges $$ ACUTE PT VISIT: 1 Visit                     John Marks DPT, PT     John Marks 06/28/2024, 2:02 PM

## 2024-06-28 NOTE — Progress Notes (Signed)
° °  Neurosurgery Progress Note  History: John Marks is s/p L4-5 XLIF, L4-S1 PSF. L5-S1 decompression  POD2: Pt doing well this morning. He reports groin and right thigh pain but feels things are well controlled on medications. He is eager to go home today.  POD1: Pt doing well this morning with some right thigh pain with movement. Has been up to the bathroom several times and feels at present his pain is well controlled  Physical Exam: Vitals:   06/28/24 0401 06/28/24 0544  BP: (!) 126/58 (!) 142/72  Pulse: 70 70  Resp: 18 17  Temp: 98.9 F (37.2 C) 98.5 F (36.9 C)  SpO2: 97% 97%    AA Ox3 CNI  Strength:5/5 throughout BLE Incision: with blood tinged dressing in place HV output overnight   Data:  Other tests/results: NA  Assessment/Plan:  JACOBI Marks is a 83 y.o presenting with lumbosacral spondylosis and sciatica s/p L4-5 XLIF, L4-S1 PSF. L5-S1 decompression  - mobilize - continue to monitor drain. Will re-evaluate for removal this afternoon - pain control - DVT prophylaxis - PTOT; brace ordered. To be worn when OOB and ambulating and comfort only. Ok to work with therapy prior to brace delivery.   Edsel Goods PA-C Department of Neurosurgery

## 2024-06-28 NOTE — Plan of Care (Signed)

## 2024-06-30 ENCOUNTER — Telehealth: Payer: Self-pay | Admitting: Neurosurgery

## 2024-06-30 NOTE — Telephone Encounter (Signed)
 Noted

## 2024-06-30 NOTE — Telephone Encounter (Signed)
 Angie with [facility] is calling to request verbal PT orders of 1x1, 2x3, and 1x5. Verbal orders given by me as okay per Almarie Pouch.   [phone]

## 2024-07-03 ENCOUNTER — Ambulatory Visit

## 2024-07-03 DIAGNOSIS — Z4689 Encounter for fitting and adjustment of other specified devices: Secondary | ICD-10-CM

## 2024-07-03 NOTE — Progress Notes (Signed)
 DOS: 06/26/24 L4-5 Lateral Lumbar Interbody Fusion, L4-S1 Posterior Spinal Fusion (open), L5-S1 Bilateral Facetectomies with Clois  Patient presents with ill fitting back brace with questions on how to adjust.   Patient demonstrated use of his brace with Large current setting. Brace noted to be improperly fitted and too large for waist size. Adjusted brace to a size between small and medium. Patient able to place brace on body without difficulty and states that it feels much better. Brace appears to fit more securely without excess overlap.   Patient complained of pain in his right thigh, stating that he was warned it would probably occur after surgery. Patient concerned about timeframe and wants to know when it will resolve. Informed patient it may take more time, but to notify us  if he experiences worsening symptoms or anything new. Patient has follow up scheduled with our office next Monday.

## 2024-07-06 ENCOUNTER — Telehealth: Payer: Self-pay | Admitting: Neurosurgery

## 2024-07-06 NOTE — Telephone Encounter (Signed)
 Noted. When Katie and I saw him for a nurse visit this week for a brace adjustment, he was getting along quite well. He has an appointment with Glade on Monday.

## 2024-07-06 NOTE — Telephone Encounter (Signed)
 Esther from [employer] is calling to let our office know that she completed the patient's OT evaluation and the patient is not in need of OT at this time.   [phone]

## 2024-07-09 NOTE — Progress Notes (Unsigned)
" ° °  REFERRING PHYSICIAN:  Auston Reyes BIRCH, Md [address] [facility] [place],  [place] [address]  DOS: 06/26/24  XLIF/PSF L4-L5 with L5-S1 decompression  HISTORY OF PRESENT ILLNESS: ZELIG GACEK is approximately 2 weeks status post above surgery. Was given oxycodone  and robaxin  on discharge from the hospital.   He has minimal LBP, but continues with his preop right leg pain. His preop left leg pain is better. He has not taking oxycodone  or robaxin . He is taking tylenol . He is wearing his back brace when up and walking.   He restarted his ELIQUIS  today.    PHYSICAL EXAMINATION:  General: Patient is well developed, well nourished, calm, collected, and in no apparent distress.   NEUROLOGICAL:  General: In no acute distress.   Awake, alert, oriented to person, place, and time.  Pupils equal round and reactive to light.  Facial tone is symmetric.     Strength:            Side Iliopsoas Quads Hamstring PF DF EHL  R 5 5 5 5 5 5   L 5 5 5 5 5 5    Incisions c/d/I, staples removed without complication in posterior incision   ROS (Neurologic):  Negative except as noted above  IMAGING: Nothing new to review.   ASSESSMENT/PLAN:  KEATS KINGRY is doing well s/p above surgery. Discussed that his right leg pain should improve with time. Treatment options reviewed with patient and following plan made:   - I have advised the patient to lift up to 10 pounds until 6 weeks after surgery (follow up with Dr. Clois).  - Reviewed wound care.  - No bending, twisting, or lifting.  - Continue on current medications including prn tylenol . He is not taking oxycodone  or robaxin .   - He restarted ELIQUIS  today 07/10/24.  - He should wear back brace when up and walking.  - Follow up as scheduled in 4 weeks and prn.   Advised to contact the office if any questions or concerns arise.  Glade Boys PA-C Department of neurosurgery "

## 2024-07-10 ENCOUNTER — Encounter: Payer: Self-pay | Admitting: Orthopedic Surgery

## 2024-07-10 ENCOUNTER — Ambulatory Visit (INDEPENDENT_AMBULATORY_CARE_PROVIDER_SITE_OTHER): Admitting: Orthopedic Surgery

## 2024-07-10 VITALS — BP 128/80 | Temp 98.8°F | Ht 71.0 in | Wt 204.0 lb

## 2024-07-10 DIAGNOSIS — M5442 Lumbago with sciatica, left side: Secondary | ICD-10-CM

## 2024-07-10 DIAGNOSIS — M48062 Spinal stenosis, lumbar region with neurogenic claudication: Secondary | ICD-10-CM

## 2024-07-10 DIAGNOSIS — Z981 Arthrodesis status: Secondary | ICD-10-CM

## 2024-07-17 MED ORDER — METHYLPREDNISOLONE 4 MG PO TBPK: ORAL_TABLET | ORAL | 0 refills | Status: DC

## 2024-07-25 NOTE — Progress Notes (Signed)
 "   Patient: John Marks      DOB: September 17, 1940  DOS: 07/31/2024 Provider: Lynwood Marus, PA-C  Reason for visit: 1 year post-op follow up left THA - 07/06/23  Patient returns for follow-up regarding left total hip arthroplasty. Surgery was performed on  07/06/23 by Dr. Aimee. Patient complains of mild pain with the hip. Patient is satisfied with the treatment so far with the outcome. He is taking OTC medication (tylenol , NSAID) for pain, which the patient describes as mild. He ambulates without assistive devices.   BMI: 28.23 kg/m  Past Medical, Social and Family History: Patient Active Problem List  Diagnosis   Hyperlipidemia   HTN (hypertension)   Diverticulosis   Disc disease, degenerative, lumbar or lumbosacral   Tinnitus   PAF (paroxysmal atrial fibrillation) (CMS/HHS-HCC)   Personal history of prostate cancer   ED (erectile dysfunction) of organic origin   Osteoarthritis of knee   ILD (interstitial lung disease) (CMS/HHS-HCC)   Rotator cuff tendinitis, left   Cervical spondylosis   LBBB (left bundle branch block)   Aortic valve stenosis   History of total left hip replacement 07/06/23   B12 deficiency   Prediabetes   SOB (shortness of breath)   Past Medical History:  Diagnosis Date   Aortic valve stenosis 06/09/2023   Cervical spondylosis 11/02/2022   Chronic constipation August 20, 2020   Chronic kidney disease 7/15   Kidney stone   Colon polyps    DDD (degenerative disc disease)    Depression 8/1   Dermatitis    Diverticulosis    Epicondylitis    GERD (gastroesophageal reflux disease)    Heart disease Atrial Fibrillation   History of cataract 9/24   Eye exam   History of pneumonia 05/1983   HTN (hypertension)    Hyperlipidemia    Hyperlipidemia    ILD (interstitial lung disease) (CMS/HHS-HCC) 07/31/2020   Osteoarthritis of knee 12/07/2018   Primary osteoarthritis of left hip 06/09/2023   Prostate CA  (CMS/HHS-HCC)    Tendinitis    Tinnitus    Past Surgical History:  Procedure Laterality Date   COLONOSCOPY  05/30/1996   Adenomatous Polyp   COLONOSCOPY  05/22/1999   Adenomatous Polyp   COLONOSCOPY  08/18/2001   PH Adenomatous Polyps   COLONOSCOPY  07/01/2010   PH Adenomatous Polyps: CBF 06/2015; Recall Ltr mailed 05/30/2015 (dw)   COLONOSCOPY  08/01/2015   Adenomatous Polyp: CBF 07/2020   pointer finger surgery Left 05/2019   COLONOSCOPY  01/22/2021   Tubular adenomas/Sessile serrated adenoma/28yrs/TKT    ureteroscopy and laser lithotripsy  Right 02/26/2023   ARTHROPLASTY HIP TOTAL Left 07/06/2023   Procedure: LEFT PRIMARY TOTAL HIP ARTHROPLASTY;  Surgeon: Aimee Juliene Dunnings, MD;  Location: [facility] OR;  Service: Orthopedics;  Laterality: Left;   BACK SURGERY  2010   Laminectomy   CATARACT EXTRACTION  11/2023   COLONOSCOPY  2017   JOINT REPLACEMENT  12/24   Left hip   PROSTATE SURGERY  2007   PROSTATECTOMY PERINEAL     SIGMOIDOSCOPY  1978   SPINE SURGERY  2009   TONSILLECTOMY  1949   Family History  Problem Relation Age of Onset   Alcohol abuse Mother    Ulcers Father    Alcohol abuse Father    Depression Father    Lung cancer Paternal Grandmother    Autoimmune disease Sister        MS   Autoimmune disease Sister        MS  Anesthesia problems Neg Hx    Allergies  Allergen Reactions   Latex Itching   Current Outpatient Medications  Medication Sig Dispense Refill   albuterol  MDI, PROVENTIL , VENTOLIN , PROAIR , HFA 90 mcg/actuation inhaler Inhale 2 inhalations into the lungs every 6 (six) hours as needed for Wheezing for up to 90 days 54 g 2   amLODIPine  (NORVASC ) 5 MG tablet Take 1 tablet (5 mg total) by mouth 2 (two) times daily 180 tablet 1   apixaban  (ELIQUIS ) 5 mg tablet Take 1 tablet (5 mg total) by mouth every 12 (twelve) hours 180 tablet 3   atorvastatin  (LIPITOR) 40 MG tablet TAKE 1 TABLET(40 MG) BY MOUTH DAILY 90 tablet 3    benralizumab (FASENRA PEN) 30 mg/mL auto-injector Inject 1 mL (30 mg total) subcutaneously as directed Once monthly for three months then every two months thereafter. 1 mL 7   brensocatib (BRINSUPRI) 10 mg Tab Take 10 mg by mouth once daily 30 tablet 5   cholecalciferol 1000 unit tablet Take 1,000 Units by mouth once daily     cyanocobalamin (VITAMIN B12) 1000 MCG tablet Take 1,000 mcg by mouth once daily     fenofibric (TRILIPIX) 135 mg DR capsule TAKE 1 CAPSULE(135 MG) BY MOUTH DAILY 90 capsule 3   flecainide  (TAMBOCOR ) 50 MG tablet Take 1 tablet (50 mg total) by mouth 2 (two) times daily 180 tablet 0   fluticasone-umeclidinium-vilanterol (TRELEGY ELLIPTA) 100-62.5-25 mcg inhaler Inhale 1 Puff into the lungs once daily 60 each 5   folic acid (FOLVITE) 1 MG tablet Take 1 tablet (1 mg total) by mouth once daily for 90 days 90 tablet 0   magnesium  oxide (MAG-OXIDE ORAL) Take 500 mg by mouth once daily     methotrexate (RHEUMATREX) 2.5 MG tablet Take 4 tablets (10 mg total) by mouth every 7 (seven) days for 90 days 48 tablet 0   metoprolol  TARTrate (LOPRESSOR ) 25 MG tablet Take 1 tablet (25 mg total) by mouth 2 (two) times daily 180 tablet 3   No current facility-administered medications for this visit.    Musculoskeletal Exam: Gait/Stance Evaluation  Gait normal  Limp None  Velocity of gait slightly slow due to recent back surgery  Assistive Device no assitive device    Lower Extremity Exam   Right Left  Strength 5/5 5/5  Vascular/Lymphatic normal normal  Neurologic normal normal      Hip Evaluation Right Left  Skin normal normal  Hip Flexion 90 90  Abduction 45 45  Internal Rotation 20 20  External Rotation 50 50  Total Range of Motion normal - 100% normal - 100%    Imaging:   AP pelvis/lateral hip films of left hip(s) were ordered, obtained and independently reviewed today revealing: LEFT: a prosthesis in place with no evidence of loosening or subsidence RIGHT:  slight joint space narrowing with mild osteophyte formation (grade 1)  Assessment:     ICD-10-CM  1. 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 Follow-up examination after orthopedic surgery  Z09    PLAN:  The findings were discussed with the patient. Continue activities as tolerated. Patient advised to avoid high impact or dangerous activities. All questions and concerns were answered. He can call any time with further concerns. Recommended surveillance follow up with films 62yr.   I personally performed the service, non-incident to.  [facility]) Lynwood Messersmith PA-C    "

## 2024-07-26 ENCOUNTER — Other Ambulatory Visit: Payer: Self-pay

## 2024-07-26 DIAGNOSIS — M48062 Spinal stenosis, lumbar region with neurogenic claudication: Secondary | ICD-10-CM

## 2024-08-03 ENCOUNTER — Ambulatory Visit: Admitting: Neurosurgery

## 2024-08-03 ENCOUNTER — Ambulatory Visit

## 2024-08-03 ENCOUNTER — Encounter: Payer: Self-pay | Admitting: Neurosurgery

## 2024-08-03 VITALS — BP 136/80 | Temp 97.9°F | Ht 71.0 in | Wt 204.0 lb

## 2024-08-03 DIAGNOSIS — M48062 Spinal stenosis, lumbar region with neurogenic claudication: Secondary | ICD-10-CM | POA: Diagnosis not present

## 2024-08-03 DIAGNOSIS — Z981 Arthrodesis status: Secondary | ICD-10-CM

## 2024-08-03 NOTE — Progress Notes (Signed)
" ° °  REFERRING PHYSICIAN:  Auston Reyes BIRCH, Md [address] [facility] [place],  [place] [address]  DOS: 06/26/24  XLIF/PSF L4-L5 with L5-S1 decompression  HISTORY OF PRESENT ILLNESS: John Marks is status post above surgery.   He has had some improvement, but not as much as he would like.  He continues to have some leg pain with walking.      PHYSICAL EXAMINATION:  General: Patient is well developed, well nourished, calm, collected, and in no apparent distress.   NEUROLOGICAL:  General: In no acute distress.   Awake, alert, oriented to person, place, and time.  Pupils equal round and reactive to light.  Facial tone is symmetric.     Strength:            Side Iliopsoas Quads Hamstring PF DF EHL  R 5 5 5 5 5 5   L 5 5 5 5 5 5    Incisions c/d/I, staples removed without complication in posterior incision   ROS (Neurologic):  Negative except as noted above  IMAGING: Nothing new to review.   ASSESSMENT/PLAN:  John Marks is doing fair s/p above surgery.  He has made improvements with physical therapy.  I will refer him for outpatient physical therapy.  If he is still having trouble at his next appointment, I would consider repeating his MRI scan and obtaining nerve conduction study.  There is some possibility that he is suffering from some vascular claudication as well, but we will reevaluate with imaging before consideration and workup of that.    We reviewed activity limitations.  He should continue to improve over the next 3 to 4 months.  John Marks Department of neurosurgery "

## 2024-08-21 ENCOUNTER — Other Ambulatory Visit (HOSPITAL_COMMUNITY): Payer: Self-pay

## 2024-09-14 ENCOUNTER — Other Ambulatory Visit

## 2024-09-14 ENCOUNTER — Encounter: Admitting: Neurosurgery
